# Patient Record
Sex: Female | Born: 1940 | Race: White | Hispanic: No | Marital: Married | Smoking: Never smoker
Health system: Southern US, Community
[De-identification: ages and names within clinical notes are randomized; demographics above are authoritative.]

## PROBLEM LIST (undated history)

## (undated) DIAGNOSIS — R05 Cough: Secondary | ICD-10-CM

## (undated) DIAGNOSIS — Z9889 Other specified postprocedural states: Secondary | ICD-10-CM

## (undated) DIAGNOSIS — K5790 Diverticulosis of intestine, part unspecified, without perforation or abscess without bleeding: Secondary | ICD-10-CM

## (undated) DIAGNOSIS — R011 Cardiac murmur, unspecified: Secondary | ICD-10-CM

## (undated) DIAGNOSIS — K589 Irritable bowel syndrome without diarrhea: Secondary | ICD-10-CM

## (undated) DIAGNOSIS — K219 Gastro-esophageal reflux disease without esophagitis: Secondary | ICD-10-CM

## (undated) DIAGNOSIS — R112 Nausea with vomiting, unspecified: Secondary | ICD-10-CM

## (undated) DIAGNOSIS — R053 Chronic cough: Secondary | ICD-10-CM

## (undated) DIAGNOSIS — M1990 Unspecified osteoarthritis, unspecified site: Secondary | ICD-10-CM

## (undated) DIAGNOSIS — J310 Chronic rhinitis: Secondary | ICD-10-CM

## (undated) DIAGNOSIS — J300 Vasomotor rhinitis: Secondary | ICD-10-CM

## (undated) DIAGNOSIS — N390 Urinary tract infection, site not specified: Secondary | ICD-10-CM

## (undated) DIAGNOSIS — K222 Esophageal obstruction: Secondary | ICD-10-CM

## (undated) DIAGNOSIS — I10 Essential (primary) hypertension: Secondary | ICD-10-CM

## (undated) HISTORY — PX: DILATION AND CURETTAGE OF UTERUS: SHX78

## (undated) HISTORY — DX: Chronic rhinitis: J31.0

## (undated) HISTORY — DX: Esophageal obstruction: K22.2

## (undated) HISTORY — DX: Cough: R05

## (undated) HISTORY — DX: Chronic cough: R05.3

## (undated) HISTORY — DX: Irritable bowel syndrome, unspecified: K58.9

## (undated) HISTORY — DX: Vasomotor rhinitis: J30.0

## (undated) HISTORY — DX: Gastro-esophageal reflux disease without esophagitis: K21.9

## (undated) HISTORY — DX: Diverticulosis of intestine, part unspecified, without perforation or abscess without bleeding: K57.90

## (undated) HISTORY — PX: APPENDECTOMY: SHX54

## (undated) HISTORY — PX: ABDOMINAL HYSTERECTOMY: SHX81

---

## 1998-11-13 ENCOUNTER — Ambulatory Visit (HOSPITAL_COMMUNITY): Admission: RE | Admit: 1998-11-13 | Discharge: 1998-11-13 | Payer: Self-pay | Admitting: Internal Medicine

## 1998-11-13 ENCOUNTER — Encounter: Payer: Self-pay | Admitting: Internal Medicine

## 1999-11-17 ENCOUNTER — Ambulatory Visit (HOSPITAL_COMMUNITY): Admission: RE | Admit: 1999-11-17 | Discharge: 1999-11-17 | Payer: Self-pay | Admitting: Internal Medicine

## 1999-11-17 ENCOUNTER — Encounter: Payer: Self-pay | Admitting: Internal Medicine

## 2000-11-28 ENCOUNTER — Encounter: Payer: Self-pay | Admitting: Internal Medicine

## 2000-11-28 ENCOUNTER — Ambulatory Visit (HOSPITAL_COMMUNITY): Admission: RE | Admit: 2000-11-28 | Discharge: 2000-11-28 | Payer: Self-pay | Admitting: Internal Medicine

## 2001-12-05 ENCOUNTER — Encounter: Payer: Self-pay | Admitting: Internal Medicine

## 2001-12-05 ENCOUNTER — Ambulatory Visit (HOSPITAL_COMMUNITY): Admission: RE | Admit: 2001-12-05 | Discharge: 2001-12-05 | Payer: Self-pay | Admitting: Internal Medicine

## 2002-06-21 ENCOUNTER — Ambulatory Visit (HOSPITAL_COMMUNITY): Admission: RE | Admit: 2002-06-21 | Discharge: 2002-06-21 | Payer: Self-pay | Admitting: Gastroenterology

## 2002-12-18 ENCOUNTER — Ambulatory Visit (HOSPITAL_COMMUNITY): Admission: RE | Admit: 2002-12-18 | Discharge: 2002-12-18 | Payer: Self-pay | Admitting: Internal Medicine

## 2002-12-18 ENCOUNTER — Encounter: Payer: Self-pay | Admitting: Internal Medicine

## 2003-12-25 ENCOUNTER — Ambulatory Visit (HOSPITAL_COMMUNITY): Admission: RE | Admit: 2003-12-25 | Discharge: 2003-12-25 | Payer: Self-pay | Admitting: Internal Medicine

## 2004-12-25 ENCOUNTER — Ambulatory Visit (HOSPITAL_COMMUNITY): Admission: RE | Admit: 2004-12-25 | Discharge: 2004-12-25 | Payer: Self-pay | Admitting: Emergency Medicine

## 2005-12-30 ENCOUNTER — Ambulatory Visit (HOSPITAL_COMMUNITY): Admission: RE | Admit: 2005-12-30 | Discharge: 2005-12-30 | Payer: Self-pay | Admitting: *Deleted

## 2006-01-28 ENCOUNTER — Ambulatory Visit: Payer: Self-pay | Admitting: Critical Care Medicine

## 2006-03-23 ENCOUNTER — Ambulatory Visit: Payer: Self-pay | Admitting: Critical Care Medicine

## 2006-12-11 ENCOUNTER — Emergency Department (HOSPITAL_COMMUNITY): Admission: EM | Admit: 2006-12-11 | Discharge: 2006-12-11 | Payer: Self-pay | Admitting: Emergency Medicine

## 2006-12-12 ENCOUNTER — Encounter: Admission: RE | Admit: 2006-12-12 | Discharge: 2006-12-12 | Payer: Self-pay | Admitting: Orthopedic Surgery

## 2007-01-06 ENCOUNTER — Ambulatory Visit (HOSPITAL_COMMUNITY): Admission: RE | Admit: 2007-01-06 | Discharge: 2007-01-06 | Payer: Self-pay | Admitting: Emergency Medicine

## 2008-01-08 ENCOUNTER — Ambulatory Visit (HOSPITAL_COMMUNITY): Admission: RE | Admit: 2008-01-08 | Discharge: 2008-01-08 | Payer: Self-pay | Admitting: Emergency Medicine

## 2008-12-04 ENCOUNTER — Encounter: Payer: Self-pay | Admitting: Critical Care Medicine

## 2009-01-08 ENCOUNTER — Ambulatory Visit (HOSPITAL_COMMUNITY): Admission: RE | Admit: 2009-01-08 | Discharge: 2009-01-08 | Payer: Self-pay | Admitting: Emergency Medicine

## 2009-06-10 ENCOUNTER — Encounter: Admission: RE | Admit: 2009-06-10 | Discharge: 2009-06-10 | Payer: Self-pay | Admitting: Allergy

## 2010-01-02 ENCOUNTER — Encounter: Admission: RE | Admit: 2010-01-02 | Discharge: 2010-01-02 | Payer: Self-pay | Admitting: Gastroenterology

## 2010-01-09 ENCOUNTER — Ambulatory Visit (HOSPITAL_COMMUNITY): Admission: RE | Admit: 2010-01-09 | Discharge: 2010-01-09 | Payer: Self-pay | Admitting: *Deleted

## 2010-09-11 NOTE — Assessment & Plan Note (Signed)
[facility]                               PULMONARY OFFICE NOTE   [name], [name]                    MRN:          [id number]  DATE:01/28/2006                            DOB:          [birth year]    This is a 70 year old white female who has a chronic cough.  She is referred  for evaluation of same.  The cough occurs in the morning and night.  It is  occasionally productive of thick white mucus, worse with exercise.  She has  had this for 10 years.  She lived in [place] for 5 years and when in occluded  environment, developed this cough.  She has been back in the [place]  and moved here.  Ever since, the cough has persisted.  Mostly mucus is  clear, but mostly also dry.  There is no wheezing.  She coughs worse if she  exercises.  She has no shortness of breath.  Mucinex DM seems to help.  Advair and Singulair did not help.  She denies any heartburn, but feels like  there is pressure in the chest.  She has some chest discomfort.  No real  overt heartburn.  No indigestion or loss of appetite.  She has some  difficulty swallowing.  Denies any sore throat.  Does have some nasal  congestion.  Denies any sneezing, itching, earache, anxiety, depression,  hand or foot swelling.  She denies any joint stiffness or pain, and her  symptoms have persisted.  She is a life-long never smoker.  Works as a  housewife.   PAST MEDICAL HISTORY:  No history of hypertension or heart disease.  Does  have a heart murmur and hypercholesterolemia.  Otherwise, no positive  medical history.  She had a hysterectomy in 1990, tubal ligation 32 years  ago, appendectomy 1990.   MEDICATION ALLERGIES:  None.   CURRENT MEDICATIONS:  1. Premarin 0.625 mg daily.  2. Multivitamin daily.  3. Calcium daily.  4. Green tea daily.  5. Fish oil daily.  6. Mucinex daily.   SOCIAL HISTORY:  Life-long never smoker but drinks 3 glasses of wine daily.  Is married.   FAMILY  HISTORY:  Noncontributory.   REVIEW OF SYSTEMS:  Otherwise noncontributory.   PHYSICAL EXAM:  This is a well-developed, well-nourished middle-aged female  in no distress.  Temperature 98, blood pressure 110/70, pulse 60, saturation 98% on room air.  CHEST:  Clear without evidence of any wheeze, rales, or rhonchi.  There were  no adventitious breath sounds noted.  CARDIAC:  Regular rate and rhythm without S3.  Normal S1, S2.  ABDOMEN:  Soft and nontender.  EXTREMITIES:  No edema, clubbing, or venous disease.  SKIN:  Intact and clear.  HEENT:  No jugular venous distension.  No lymphadenopathy.  Nares were clear  except for mild nares inflammation.   LABORATORY DATA:  Spirometry shows normal spirometry.  Chest x-ray showed no  active disease process.   IMPRESSION:  Cyclical cough with associated reflux disease.  No true asthma.   RECOMMENDATIONS:  Begin BroveX 5 mL b.i.d.  Begin  Zegerid 40 mg daily.  No  systemic steroids or inhaled medicines are needed.  She will discontinue the  fish oil.  She was given a reflux diet and will be prescribed BroveX 5 mL  b.i.d.  Also given the cyclical cough protocol with Tussionex and Tessalon  Perles.  Will see the patient back in return followup in 1 month.       Charlcie Cradle Delford Field, MD, [facility]      PEW/MedQ  DD:  01/28/2006  DT:  01/31/2006  Job #:  [id number]   cc:   Oley Balm. Georgina Pillion, M.D.

## 2010-09-11 NOTE — Assessment & Plan Note (Signed)
[facility]                             PULMONARY OFFICE NOTE   CLOTEAL, ISAACSON                    MRN:          [id number]  DATE:03/23/2006                            DOB:          1940-10-06    Ms. [name] is a 70 year old white female, history of cyclic cough,  reflux disease, but no true asthma.  Her cough is still persisting but  slightly better.  She is on the Brovex nightly, Zegerid daily.  She is  off the fish oil.  She uses [employer] only p.r.n.   PHYSICAL EXAMINATION:  Temp 97, blood pressure 120/78, pulse 63,  saturation 97% on room air.  CHEST:  Showed to be clear without evidence of wheeze or rhonchi.  CARDIAC:  Exam showed a regular rate and rhythm without S3, normal S1,  S2.  ABDOMEN:  Soft, nontender.  EXTREMITIES:  Showed no edema or clubbing.  SKIN:  Clear.   IMPRESSION:  1. Cyclical cough.  2. Mild reflux disease.   PLAN:  1. Finish course of Zegerid.  2. Use Ultram 50 mg q. 6 on schedule for 10 days then stop.  3. Use Brovex nightly.  4. Use Tessalon Perles 1 to 2 every eight hours on schedule.  5. We will see the patient back in return followup in 6 weeks.     Charlcie Cradle Delford Field, MD, [facility]  Electronically Signed    PEW/MedQ  DD: 03/23/2006  DT: 03/23/2006  Job #: [id number]

## 2010-12-01 ENCOUNTER — Other Ambulatory Visit (HOSPITAL_COMMUNITY): Payer: Self-pay | Admitting: Family Medicine

## 2010-12-01 DIAGNOSIS — Z1231 Encounter for screening mammogram for malignant neoplasm of breast: Secondary | ICD-10-CM

## 2010-12-29 ENCOUNTER — Telehealth: Payer: Self-pay | Admitting: Gastroenterology

## 2010-12-29 NOTE — Telephone Encounter (Signed)
Forwarded to Dr. Arlyce Dice for review.

## 2011-01-11 ENCOUNTER — Ambulatory Visit (HOSPITAL_COMMUNITY)
Admission: RE | Admit: 2011-01-11 | Discharge: 2011-01-11 | Disposition: A | Discharge status: Home / Self Care | Payer: Medicare Other | Source: Ambulatory Visit | Attending: Family Medicine | Admitting: Family Medicine

## 2011-01-11 DIAGNOSIS — Z1231 Encounter for screening mammogram for malignant neoplasm of breast: Secondary | ICD-10-CM | POA: Insufficient documentation

## 2011-01-21 ENCOUNTER — Encounter: Payer: Self-pay | Admitting: Gastroenterology

## 2011-02-18 ENCOUNTER — Ambulatory Visit (INDEPENDENT_AMBULATORY_CARE_PROVIDER_SITE_OTHER): Payer: Medicare Other | Admitting: Gastroenterology

## 2011-02-18 ENCOUNTER — Encounter: Payer: Self-pay | Admitting: Gastroenterology

## 2011-02-18 DIAGNOSIS — R1310 Dysphagia, unspecified: Secondary | ICD-10-CM | POA: Insufficient documentation

## 2011-02-18 DIAGNOSIS — K219 Gastro-esophageal reflux disease without esophagitis: Secondary | ICD-10-CM | POA: Insufficient documentation

## 2011-02-18 NOTE — Assessment & Plan Note (Addendum)
She continues to be quite symptomatic despite medical therapy.  Recommendations #1 upper GI series to determine her anatomy. #2 trial of Exelon to 60 mg before breakfast and dinner #3 to consider gastric M.D. scan pending results of above and her response to Dexon #4 if the patient remains symptomatic despite medical therapy then I would consider a fundoplication

## 2011-02-18 NOTE — Progress Notes (Signed)
[name] is a pleasant 70 year old white female referred at the request of Dr.Thacker for evaluation of cough. For years she's been suffering from a chronic cough that was recently diagnosed as asthma. Despite taking PPIs cough has continued. She denies sore throat or hoarseness or pyrosis. She sleeps sitting up. Symptoms are worsened postprandially. She has a history of a esophageal stricture which was dilated in 2009. She is complaining of recurrent dysphagia to solids. She does drink wine and drinks coffee. She denies abdominal pain.      Past Medical History  Diagnosis Date  . Chronic cough   . Rhinitis   . Vasomotor rhinitis   . GERD (gastroesophageal reflux disease)   . Diverticulosis     left sided  2004  . Esophageal stricture   . IBS (irritable bowel syndrome)    Past Surgical History  Procedure Date  . Appendectomy   . Abdominal hysterectomy     reports that she has never smoked. She has never used smokeless tobacco. She reports that she drinks alcohol. She reports that she does not use illicit drugs. family history includes Heart disease in an unspecified family member.  Current medications and social history were reviewed in [employer] electronic medical record  Review of Systems: She complains of soreness in her right arm which each abuse to an old fracture Pertinent positive and negative review of systems were noted in the above HPI section. All other review of systems were otherwise negative.  Vital signs were reviewed in today's medical record Physical Exam: General: Well developed , well nourished, no acute distress Head: Normocephalic and atraumatic Eyes:  sclerae anicteric, EOMI Ears: Normal auditory acuity Mouth: No deformity or lesions Neck: Supple, no masses or thyromegaly Lungs: Clear throughout to auscultation Heart: Regular rate and rhythm; no murmurs, rubs or bruits Abdomen: Soft, non tender and non distended. No masses, hepatosplenomegaly or  hernias noted. Normal Bowel sounds Rectal:deferred Musculoskeletal: Symmetrical with no gross deformities  Skin: No lesions on visible extremities Pulses:  Normal pulses noted Extremities: No clubbing, cyanosis, edema or deformities noted Neurological: Alert oriented x 4, grossly nonfocal Cervical Nodes:  No significant cervical adenopathy Inguinal Nodes: No significant inguinal adenopathy Psychological:  Alert and cooperative. Normal mood and affect

## 2011-02-18 NOTE — Assessment & Plan Note (Signed)
This is likely due to a recurrent esophageal stricture.  Recommendations #1 barium swallow

## 2011-02-18 NOTE — Patient Instructions (Addendum)
We are giving you Dexilant samples today You are scheduled for a Upper GI Series at [facility] on 02/23/2011 at 10:45am If you need to reschedule this appointment please contact radiology at [phone]

## 2011-02-23 ENCOUNTER — Other Ambulatory Visit (HOSPITAL_COMMUNITY): Payer: Medicare Other

## 2011-02-23 ENCOUNTER — Ambulatory Visit (HOSPITAL_COMMUNITY)
Admission: RE | Admit: 2011-02-23 | Discharge: 2011-02-23 | Disposition: A | Discharge status: Home / Self Care | Payer: Medicare Other | Source: Ambulatory Visit | Attending: Gastroenterology | Admitting: Gastroenterology

## 2011-02-23 ENCOUNTER — Telehealth: Payer: Self-pay

## 2011-02-23 DIAGNOSIS — R059 Cough, unspecified: Secondary | ICD-10-CM | POA: Insufficient documentation

## 2011-02-23 DIAGNOSIS — R05 Cough: Secondary | ICD-10-CM | POA: Insufficient documentation

## 2011-02-23 DIAGNOSIS — K219 Gastro-esophageal reflux disease without esophagitis: Secondary | ICD-10-CM | POA: Insufficient documentation

## 2011-02-23 DIAGNOSIS — R1310 Dysphagia, unspecified: Secondary | ICD-10-CM | POA: Insufficient documentation

## 2011-02-23 NOTE — Telephone Encounter (Signed)
Pt states that Dr. Arlyce Dice placed her on Dexilant 60mg  in the am and pm. Pt has 10 pills left and wants to know if she should continue taking it twice a day. Dr. Arlyce Dice please advise.

## 2011-02-23 NOTE — Telephone Encounter (Signed)
Message copied by Michele Mcalpine on Tue Feb 23, 2011  4:36 PM ------      Message from: Melvia Heaps D      Created: Tue Feb 23, 2011  2:19 PM       Please schedule EGD with [facility] pH study ; she should continue taking her PPI

## 2011-02-24 NOTE — Telephone Encounter (Signed)
yes

## 2011-02-25 ENCOUNTER — Telehealth: Payer: Self-pay

## 2011-02-25 MED ORDER — DEXLANSOPRAZOLE 60 MG PO CPDR: 60.0000 mg | DELAYED_RELEASE_CAPSULE | Freq: Two times a day (BID) | ORAL | Status: DC

## 2011-02-25 NOTE — Telephone Encounter (Signed)
Booking number for EGD with [facility] #[phone].

## 2011-02-25 NOTE — Telephone Encounter (Signed)
Left message for pt to call back  °

## 2011-02-25 NOTE — Telephone Encounter (Signed)
Pt scheduled for EGD with [facility] @[facility]  03/22/11 arrival time 11:30am for a 12:30pm appt. Pt aware of appt date and time. Rx for dexilant sent to pharmacy.

## 2011-02-26 ENCOUNTER — Encounter: Payer: Medicare Other | Admitting: Gastroenterology

## 2011-03-01 ENCOUNTER — Telehealth: Payer: Self-pay | Admitting: Gastroenterology

## 2011-03-01 NOTE — Telephone Encounter (Signed)
Returned pts call, Informed her that an authorization was completed and submitted on Friday to her insurance company, Have not heard back from her [employer] yet on whether its been approved or not

## 2011-03-01 NOTE — Telephone Encounter (Signed)
Dottie spoke with pt, will provide samples for pt till her office appointment. Pharmacy wants 90 day supply sent but pt needs to be seen in the office first,

## 2011-03-02 ENCOUNTER — Telehealth: Payer: Self-pay | Admitting: *Deleted

## 2011-03-02 NOTE — Telephone Encounter (Signed)
Patient aware Dexilant approved till 2014

## 2011-03-22 ENCOUNTER — Encounter (HOSPITAL_COMMUNITY): Payer: Self-pay | Admitting: Gastroenterology

## 2011-03-22 ENCOUNTER — Encounter (HOSPITAL_COMMUNITY)
Admission: RE | Disposition: A | Discharge status: Home / Self Care | Payer: Self-pay | Source: Ambulatory Visit | Attending: Gastroenterology

## 2011-03-22 ENCOUNTER — Ambulatory Visit (HOSPITAL_COMMUNITY)
Admission: RE | Admit: 2011-03-22 | Discharge: 2011-03-22 | Disposition: A | Discharge status: Home / Self Care | Payer: Medicare Other | Source: Ambulatory Visit | Attending: Gastroenterology | Admitting: Gastroenterology

## 2011-03-22 DIAGNOSIS — R05 Cough: Secondary | ICD-10-CM | POA: Insufficient documentation

## 2011-03-22 DIAGNOSIS — K219 Gastro-esophageal reflux disease without esophagitis: Secondary | ICD-10-CM

## 2011-03-22 DIAGNOSIS — R059 Cough, unspecified: Secondary | ICD-10-CM | POA: Insufficient documentation

## 2011-03-22 HISTORY — DX: Cardiac murmur, unspecified: R01.1

## 2011-03-22 HISTORY — PX: BRAVO PH STUDY: SHX5421

## 2011-03-22 HISTORY — PX: ESOPHAGOGASTRODUODENOSCOPY: SHX5428

## 2011-03-22 HISTORY — DX: Unspecified osteoarthritis, unspecified site: M19.90

## 2011-03-22 SURGERY — EGD (ESOPHAGOGASTRODUODENOSCOPY)
Anesthesia: Moderate Sedation

## 2011-03-22 MED ORDER — DEXLANSOPRAZOLE 60 MG PO CPDR: 60.0000 mg | DELAYED_RELEASE_CAPSULE | Freq: Two times a day (BID) | ORAL | Status: DC

## 2011-03-22 MED ORDER — FENTANYL CITRATE 0.05 MG/ML IJ SOLN
INTRAMUSCULAR | Status: AC
  Filled 2011-03-22: qty 2

## 2011-03-22 MED ORDER — MIDAZOLAM HCL 10 MG/2ML IJ SOLN
INTRAMUSCULAR | Status: AC
  Filled 2011-03-22: qty 2

## 2011-03-22 MED ORDER — GLYCOPYRROLATE 0.2 MG/ML IJ SOLN
INTRAMUSCULAR | Status: DC | PRN
  Administered 2011-03-22: 0.2 mg via INTRAVENOUS

## 2011-03-22 MED ORDER — GLYCOPYRROLATE 0.2 MG/ML IJ SOLN
INTRAMUSCULAR | Status: AC
  Filled 2011-03-22: qty 1

## 2011-03-22 MED ORDER — FENTANYL NICU IV SYRINGE 50 MCG/ML
INJECTION | INTRAMUSCULAR | Status: DC | PRN
  Administered 2011-03-22 (×3): 25 ug via INTRAVENOUS

## 2011-03-22 MED ORDER — BUTAMBEN-TETRACAINE-BENZOCAINE 2-2-14 % EX AERO
INHALATION_SPRAY | CUTANEOUS | Status: DC | PRN
  Administered 2011-03-22: 1 via TOPICAL

## 2011-03-22 MED ORDER — MIDAZOLAM HCL 5 MG/5ML IJ SOLN
INTRAMUSCULAR | Status: DC | PRN
  Administered 2011-03-22: 2 mg via INTRAVENOUS
  Administered 2011-03-22: 1 mg via INTRAVENOUS
  Administered 2011-03-22: 2 mg via INTRAVENOUS

## 2011-03-22 MED ORDER — SODIUM CHLORIDE 0.9 % IV SOLN
INTRAVENOUS | Status: DC
  Administered 2011-03-22: 20 mL via INTRAVENOUS

## 2011-03-22 NOTE — H&P (Signed)
Dysphagia, unspecified     787.20      Reason for Visit     Gastrophageal Reflux    Dysphagia       Reason For Visit History Recorded        Vitals - Last Recorded       BP Pulse Ht Wt BMI    156/70  64  5\' 2"  (1.575 m)  52.617 kg (116 lb)  21.22 kg/m2       Progress Notes     Linda Heaps, MD  02/19/2011  9:20 AM  Signed [name] is a pleasant 70 year old white female referred at the request of Dr.Thacker for evaluation of cough. For years she's been suffering from a chronic cough that was recently diagnosed as asthma. Despite taking PPIs cough has continued. She denies sore throat or hoarseness or pyrosis. She sleeps sitting up. Symptoms are worsened postprandially. She has a history of a esophageal stricture which was dilated in 2009. She is complaining of recurrent dysphagia to solids. She does drink wine and drinks coffee. She denies abdominal pain.            Past Medical History   Diagnosis  Date   .  Chronic cough     .  Rhinitis     .  Vasomotor rhinitis     .  GERD (gastroesophageal reflux disease)     .  Diverticulosis         left sided  2004   .  Esophageal stricture     .  IBS (irritable bowel syndrome)      Past Surgical History   Procedure  Date   .  Appendectomy     .  Abdominal hysterectomy      reports that she has never smoked. She has never used smokeless tobacco. She reports that she drinks alcohol. She reports that she does not use illicit drugs. family history includes Heart disease in an unspecified family member.   Current medications and social history were reviewed in [employer] electronic medical record   Review of Systems: She complains of soreness in her right arm which each abuse to an old fracture Pertinent positive and negative review of systems were noted in the above HPI section. All other review of systems were otherwise negative.   Vital signs were reviewed in today's medical record Physical Exam: General:  Well developed , well nourished, no acute distress Head: Normocephalic and atraumatic Eyes:  sclerae anicteric, EOMI Ears: Normal auditory acuity Mouth: No deformity or lesions Neck: Supple, no masses or thyromegaly Lungs: Clear throughout to auscultation Heart: Regular rate and rhythm; no murmurs, rubs or bruits Abdomen: Soft, non tender and non distended. No masses, hepatosplenomegaly or hernias noted. Normal Bowel sounds Rectal:deferred Musculoskeletal: Symmetrical with no gross deformities   Skin: No lesions on visible extremities Pulses:  Normal pulses noted Extremities: No clubbing, cyanosis, edema or deformities noted Neurological: Alert oriented x 4, grossly nonfocal Cervical Nodes:  No significant cervical adenopathy Inguinal Nodes: No significant inguinal adenopathy Psychological:  Alert and cooperative. Normal mood and affect                  Dysphagia, unspecified - Linda Heaps, MD  02/18/2011 11:45 AM  Signed This is likely due to a recurrent esophageal stricture.   Recommendations #1 barium swallow  Esophageal reflux - Linda Heaps, MD  02/19/2011  9:20 AM  Addendum She continues to be quite symptomatic despite medical therapy.  Recommendations #1 upper GI series to determine her anatomy. #2 trial of Exelon to 60 mg before breakfast and dinner #3 to consider gastric M.D. scan pending results of above and her response to Dexon #4 if the patient remains symptomatic despite medical therapy then I would consider a fundoplication   The recent H&P (dated *02/18/11**) was reviewed, the patient was examined and there is no change in the patients condition since that H&P was completed.   [name]  03/22/2011, 12:48 PM

## 2011-03-22 NOTE — Brief Op Note (Signed)
03/22/2011  1:18 PM  PATIENT:  [name]  70 y.o. female  Endoscopy report is contained in the [facility] report.

## 2011-03-23 ENCOUNTER — Encounter (HOSPITAL_COMMUNITY): Payer: Self-pay | Admitting: Gastroenterology

## 2011-03-25 ENCOUNTER — Encounter (HOSPITAL_COMMUNITY): Payer: Self-pay

## 2011-04-01 ENCOUNTER — Ambulatory Visit (INDEPENDENT_AMBULATORY_CARE_PROVIDER_SITE_OTHER): Payer: Medicare Other | Admitting: Gastroenterology

## 2011-04-01 ENCOUNTER — Encounter: Payer: Self-pay | Admitting: Gastroenterology

## 2011-04-01 VITALS — BP 120/64 | HR 72 | Ht 62.0 in | Wt 115.0 lb

## 2011-04-01 DIAGNOSIS — K219 Gastro-esophageal reflux disease without esophagitis: Secondary | ICD-10-CM

## 2011-04-01 DIAGNOSIS — R059 Cough, unspecified: Secondary | ICD-10-CM

## 2011-04-01 DIAGNOSIS — R05 Cough: Secondary | ICD-10-CM

## 2011-04-01 MED ORDER — ALPRAZOLAM 1 MG PO TABS: ORAL_TABLET | ORAL | Status: DC

## 2011-04-01 NOTE — Progress Notes (Signed)
Patient ID: [name], female   DOB: July 07, 1940, 70 y.o.   MRN: [id number] 48 hour pH probe was placed on March 22, 2011. Patient continued her PPI medicine.  On day one there were 63 episodes of reflux. Total time pH less than 4 was 27 minutes the fraction of time pH less than 4 was 2.2. Total DeMeester score is 12.2. On day 2 was only one episode of reflux. Total DeMeester score was 0.5. The patient had multiple episodes of coughing in the absence of documented acid reflux.  Impression No evidence of significant acid reflux during the 48 hours of observation.

## 2011-04-01 NOTE — Patient Instructions (Signed)
We are printing you a prescription today You will need to call Bonita Quin back next week and let her know how your doing for Dr Arlyce Dice Follow up in February

## 2011-04-01 NOTE — Progress Notes (Signed)
History of Present Illness:  [name] has returned for followup of her cough. 48 hour [facility] pH study did not demonstrate significant acid reflux.  No strictures were seen. During this time she had frequent coughing spells. She describes the need to cough to clear her throat. She admits to being a very tense, high wired person.      Review of Systems: Pertinent positive and negative review of systems were noted in the above HPI section. All other review of systems were otherwise negative.    Current Medications, Allergies, Past Medical History, Past Surgical History, Family History and Social History were reviewed in [employer] electronic medical record  Vital signs were reviewed in today's medical record. Physical Exam: General: Well developed , well nourished, no acute distress

## 2011-04-01 NOTE — Assessment & Plan Note (Addendum)
The patient has a sensation of globus and may be coughing on account of this. This may be due to anxiety.  Alternatively she may have postnasal drip causing coughing.  Non acid reflux is also a consideration.  Recommendations #1 trial of Xanax 1 mg every 8-12 hours #2 if he's not improved with Xanax alone then I will obtain a gastric empty scan and start her on Zyrtec and Mucinex

## 2011-12-09 ENCOUNTER — Other Ambulatory Visit (HOSPITAL_COMMUNITY): Payer: Self-pay | Admitting: Family Medicine

## 2011-12-09 DIAGNOSIS — Z1231 Encounter for screening mammogram for malignant neoplasm of breast: Secondary | ICD-10-CM

## 2012-01-14 ENCOUNTER — Ambulatory Visit (HOSPITAL_COMMUNITY)
Admission: RE | Admit: 2012-01-14 | Discharge: 2012-01-14 | Disposition: A | Discharge status: Home / Self Care | Payer: Medicare Other | Source: Ambulatory Visit | Attending: Family Medicine | Admitting: Family Medicine

## 2012-01-14 DIAGNOSIS — Z1231 Encounter for screening mammogram for malignant neoplasm of breast: Secondary | ICD-10-CM | POA: Insufficient documentation

## 2012-12-14 ENCOUNTER — Other Ambulatory Visit (HOSPITAL_COMMUNITY): Payer: Self-pay | Admitting: Family Medicine

## 2012-12-14 DIAGNOSIS — Z1231 Encounter for screening mammogram for malignant neoplasm of breast: Secondary | ICD-10-CM

## 2013-01-31 ENCOUNTER — Ambulatory Visit (HOSPITAL_COMMUNITY)
Admission: RE | Admit: 2013-01-31 | Discharge: 2013-01-31 | Disposition: A | Discharge status: Home / Self Care | Payer: Medicare Other | Source: Ambulatory Visit | Attending: Family Medicine | Admitting: Family Medicine

## 2013-01-31 DIAGNOSIS — Z1231 Encounter for screening mammogram for malignant neoplasm of breast: Secondary | ICD-10-CM | POA: Insufficient documentation

## 2014-01-01 ENCOUNTER — Other Ambulatory Visit (HOSPITAL_COMMUNITY): Payer: Self-pay | Admitting: Family Medicine

## 2014-01-01 DIAGNOSIS — Z1231 Encounter for screening mammogram for malignant neoplasm of breast: Secondary | ICD-10-CM

## 2014-02-05 ENCOUNTER — Other Ambulatory Visit (HOSPITAL_COMMUNITY): Payer: Self-pay | Admitting: Family Medicine

## 2014-02-05 ENCOUNTER — Ambulatory Visit (HOSPITAL_COMMUNITY)
Admission: RE | Admit: 2014-02-05 | Discharge: 2014-02-05 | Disposition: A | Discharge status: Home / Self Care | Payer: Medicare PPO | Source: Ambulatory Visit | Attending: Family Medicine | Admitting: Family Medicine

## 2014-02-05 DIAGNOSIS — Z1231 Encounter for screening mammogram for malignant neoplasm of breast: Secondary | ICD-10-CM

## 2015-01-08 ENCOUNTER — Other Ambulatory Visit: Payer: Self-pay

## 2015-01-08 DIAGNOSIS — Z1231 Encounter for screening mammogram for malignant neoplasm of breast: Secondary | ICD-10-CM

## 2015-02-12 ENCOUNTER — Ambulatory Visit
Admission: RE | Admit: 2015-02-12 | Discharge: 2015-02-12 | Disposition: A | Discharge status: Home / Self Care | Payer: Medicare PPO | Source: Ambulatory Visit

## 2015-02-12 DIAGNOSIS — Z1231 Encounter for screening mammogram for malignant neoplasm of breast: Secondary | ICD-10-CM

## 2016-01-19 ENCOUNTER — Other Ambulatory Visit: Payer: Self-pay | Admitting: Family Medicine

## 2016-01-19 DIAGNOSIS — Z1231 Encounter for screening mammogram for malignant neoplasm of breast: Secondary | ICD-10-CM

## 2016-02-16 ENCOUNTER — Ambulatory Visit
Admission: RE | Admit: 2016-02-16 | Discharge: 2016-02-16 | Disposition: A | Discharge status: Home / Self Care | Payer: Medicare PPO | Source: Ambulatory Visit | Attending: Family Medicine | Admitting: Family Medicine

## 2016-02-16 DIAGNOSIS — Z1231 Encounter for screening mammogram for malignant neoplasm of breast: Secondary | ICD-10-CM

## 2017-01-24 ENCOUNTER — Other Ambulatory Visit: Payer: Self-pay | Admitting: Family Medicine

## 2017-01-24 DIAGNOSIS — Z1231 Encounter for screening mammogram for malignant neoplasm of breast: Secondary | ICD-10-CM

## 2017-02-15 ENCOUNTER — Other Ambulatory Visit: Payer: Self-pay | Admitting: Family Medicine

## 2017-02-15 DIAGNOSIS — R739 Hyperglycemia, unspecified: Secondary | ICD-10-CM | POA: Diagnosis not present

## 2017-02-15 DIAGNOSIS — E785 Hyperlipidemia, unspecified: Secondary | ICD-10-CM | POA: Diagnosis not present

## 2017-02-15 DIAGNOSIS — Z23 Encounter for immunization: Secondary | ICD-10-CM | POA: Diagnosis not present

## 2017-02-15 DIAGNOSIS — R05 Cough: Secondary | ICD-10-CM | POA: Diagnosis not present

## 2017-02-15 DIAGNOSIS — R059 Cough, unspecified: Secondary | ICD-10-CM

## 2017-02-15 DIAGNOSIS — I10 Essential (primary) hypertension: Secondary | ICD-10-CM | POA: Diagnosis not present

## 2017-02-16 ENCOUNTER — Ambulatory Visit
Admission: RE | Admit: 2017-02-16 | Discharge: 2017-02-16 | Disposition: A | Discharge status: Home / Self Care | Payer: Medicare PPO | Source: Ambulatory Visit | Attending: Family Medicine | Admitting: Family Medicine

## 2017-02-16 DIAGNOSIS — R05 Cough: Secondary | ICD-10-CM | POA: Diagnosis not present

## 2017-02-16 DIAGNOSIS — R059 Cough, unspecified: Secondary | ICD-10-CM

## 2017-02-21 ENCOUNTER — Ambulatory Visit
Admission: RE | Admit: 2017-02-21 | Discharge: 2017-02-21 | Disposition: A | Discharge status: Home / Self Care | Payer: Medicare PPO | Source: Ambulatory Visit | Attending: Family Medicine | Admitting: Family Medicine

## 2017-02-21 DIAGNOSIS — Z1231 Encounter for screening mammogram for malignant neoplasm of breast: Secondary | ICD-10-CM

## 2017-03-03 ENCOUNTER — Encounter: Payer: Self-pay | Admitting: Internal Medicine

## 2017-03-03 ENCOUNTER — Ambulatory Visit: Payer: Medicare PPO | Admitting: Internal Medicine

## 2017-03-03 VITALS — BP 122/66 | HR 60 | Ht 62.75 in | Wt 122.0 lb

## 2017-03-03 DIAGNOSIS — R058 Other specified cough: Secondary | ICD-10-CM

## 2017-03-03 DIAGNOSIS — R05 Cough: Secondary | ICD-10-CM

## 2017-03-03 DIAGNOSIS — I10 Essential (primary) hypertension: Secondary | ICD-10-CM | POA: Diagnosis not present

## 2017-03-03 MED ORDER — LOSARTAN POTASSIUM 50 MG PO TABS: 50.0000 mg | ORAL_TABLET | Freq: Every day | ORAL | 11 refills | Status: AC

## 2017-03-03 NOTE — Progress Notes (Signed)
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 Pulmonary Consult    Referred by Dr. Orland Mustard. Pt c/o cough for 15-20 yrs. Cough is prod with white "foam"- bothers her most first thing in the am.  She started on ACE approx 3 months ago and her cough has not been worse.    i20 yeaer daily incessant cough pattern; coughed mostly  daytime w/in 30 min of stirring  but since acei also waking some at night as well but minimal white mucus production assoc with this  - "the only thing that ever helped my allergies really is drixoral" but not she took the drixoral for sneezing/not coughing / on biphosphonates just for the last 3 years or so and only on boniva monthly  Not limited by breathing from desired activities but ex tends to make the cough worse.    No obvious day to day or daytime variability or assoc excess/ purulent sputum or mucus plugs or hemoptysis or cp or chest tightness, subjective wheeze or overt sinus or hb symptoms. No unusual exp hx or h/o childhood pna/ asthma or knowledge of premature birth.  Sleeping ok flat without nocturnal  or early am exacerbation  of respiratory  c/o's or need for noct saba. Also denies any obvious fluctuation of symptoms with weather or environmental changes or other aggravating or alleviating factors except as  outlined above   Current Allergies, Complete Past Medical History, Past Surgical History, Family History, and Social History were reviewed in [employer] record.  ROS  The following are not active complaints unless bolded Hoarseness, sore throat, dysphagia, dental problems, itching, sneezing,  nasal congestion or discharge of excess mucus or purulent secretions, ear ache,   fever, chills, sweats, unintended wt loss or wt gain, classically pleuritic or exertional cp,  orthopnea pnd or leg swelling, presyncope, palpitations, abdominal pain, anorexia, nausea, vomiting, diarrhea  or change in bowel habits or change in bladder habits, change in stools or change in urine, dysuria, hematuria,  rash, arthralgias, visual complaints, headache, numbness, weakness or ataxia or problems with walking or coordination,  change in mood/affect or memory.        Current Meds  Medication Sig  . boniva  q month.  Marland Kitchen atorvastatin (LIPITOR) 10 MG tablet Take 10 mg daily by mouth.  . Calcium Carbonate-Vitamin D (CALCIUM 600/VITAMIN D) 600-400 MG-UNIT per chew tablet Chew 1 tablet by mouth daily.    . Cholecalciferol (D3-50 PO) Take 1 capsule daily by mouth.  . Coenzyme Q10 (CO Q 10) 100 MG CAPS Take 1 capsule daily by mouth.  . hydrochlorothiazide (HYDRODIURIL) 25 MG tablet Take 25 mg daily by mouth.  . vitamin B-12 (CYANOCOBALAMIN) 500 MCG tablet Take  500 mcg daily by mouth.  . [  lisinopril (PRINIVIL,ZESTRIL) 2.5 MG tablet Take 2.5 mg daily by mouth.                Review of Systems     Objective:   Physical Exam     amb wf nad very hard to get her to talk about specific symptoms, prefers medical diagnoses    Wt Readings from Last 3 Encounters:  03/03/17 122 lb (55.3 kg)  04/01/11 115 lb (52.2 kg)  03/22/11 115 lb (52.2 kg)    Vital signs reviewed - Note on arrival 02 sats  98% on RA     HEENT: nl dentition, turbinates bilaterally, and oropharynx. Nl external ear canals  without cough reflex   NECK :  without JVD/Nodes/TM/ nl carotid upstrokes bilaterally   LUNGS: no acc muscle use,  Nl contour chest which is clear to A and P bilaterally without cough on insp or exp maneuvers   CV:  RRR  no s3 or murmur or increase in P2, and no edema   ABD:  soft and nontender with nl inspiratory excursion in the supine position. No bruits or organomegaly appreciated, bowel sounds nl  MS:  Nl gait/ ext warm without deformities, calf tenderness, cyanosis or clubbing No obvious joint restrictions   SKIN: warm and dry without lesions    NEURO:  alert, approp, nl sensorium with  no motor or cerebellar deficits apparent.      CXR PA and Lateral:   03/03/2017 :    I personally reviewed images and agree with radiology impression as follows:    Negative two view chest x-ray      Assessment:

## 2017-03-03 NOTE — Patient Instructions (Addendum)
For drainage / throat tickle try take CHLORPHENIRAMINE  4 mg - take one every 4 hours as needed - available over the counter- may cause drowsiness so start with just a bedtime dose or two and see how you tolerate it before trying in daytime     Also take Pepcid 20 mg one at bedtime automatically at bedtime   GERD (REFLUX)  is an extremely common cause of respiratory symptoms just like yours , many times with no obvious heartburn at all.    It can be treated with medication, but also with lifestyle changes including elevation of the head of your bed (ideally with 6 inch  bed blocks),  Smoking cessation, avoidance of late meals, excessive alcohol, and avoid fatty foods, chocolate, peppermint, colas, red wine, and acidic juices such as orange juice.  NO MINT OR MENTHOL PRODUCTS SO NO COUGH DROPS  USE SUGARLESS CANDY INSTEAD (Jolley ranchers or Stover's or Life Savers) or even ice chips will also do - the key is to swallow to prevent all throat clearing. NO OIL BASED VITAMINS - use powdered substitutes.   Stop lisinopril and take losartan 50 mg one daily instead    If not all better in 6 weeks please return with all active medications in hand

## 2017-03-04 ENCOUNTER — Encounter: Payer: Self-pay | Admitting: Internal Medicine

## 2017-03-04 ENCOUNTER — Institutional Professional Consult (permissible substitution): Payer: Medicare PPO | Admitting: Pulmonary Disease

## 2017-03-04 DIAGNOSIS — R058 Other specified cough: Secondary | ICD-10-CM | POA: Insufficient documentation

## 2017-03-04 DIAGNOSIS — R05 Cough: Secondary | ICD-10-CM | POA: Insufficient documentation

## 2017-03-04 NOTE — Assessment & Plan Note (Signed)
ACE inhibitors are problematic in  pts with airway complaints because  even experienced pulmonologists can't always distinguish ace effects from copd/asthma.  By themselves they don't actually cause a problem, much like oxygen can't by itself start a fire, but they certainly serve as a powerful catalyst or enhancer for any "fire"  or inflammatory process in the upper airway, be it caused by an ET  tube or more commonly reflux (especially in the obese or pts with known GERD or who are on biphoshonates).    In the era of ARB near equivalency  it just makes sense to avoid ACEI  entirely in the short run and then decide later, having established a level of airway control using a reasonable limited regimen, whether to add back ace but even then being very careful to observe the pt for worsening airway control and number of meds used/ needed to control symptoms.    Try losartan 50 mg daily for now

## 2017-03-04 NOTE — Assessment & Plan Note (Signed)
The most common causes of chronic cough in immunocompetent adults include the following: upper airway cough syndrome (UACS), previously referred to as postnasal drip syndrome (PNDS), which is caused by variety of rhinosinus conditions; (2) asthma; (3) GERD; (4) chronic bronchitis from cigarette smoking or other inhaled environmental irritants; (5) nonasthmatic eosinophilic bronchitis; and (6) bronchiectasis.   These conditions, singly or in combination, have accounted for up to 94% of the causes of chronic cough in prospective studies.   Other conditions have constituted no >6% of the causes in prospective studies These have included bronchogenic carcinoma, chronic interstitial pneumonia, sarcoidosis, left ventricular failure, ACEI-induced cough, and aspiration from a condition associated with pharyngeal dysfunction.    Chronic cough is often simultaneously caused by more than one condition. A single cause has been found from 38 to 82% of the time, multiple causes from 18 to 62%. Multiply caused cough has been the result of three diseases up to 42% of the time.       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   These are the same pts (now being labeled as having "irritable larynx syndrome" by some cough centers) who not infrequently have a history of having failed to tolerate ace inhibitors,  dry powder inhalers or biphosphonates or report having atypical/extraesophageal reflux symptoms that don't respond to standard doses of PPI  and are easily  confused as having aecopd or asthma flares by even experienced allergists/ pulmonologists (myself included).    It is unlikely we'll be able to completely eliminate a daily cough x 20 years s the use of gabapentin but reasonable to start with 1st gen H1 blockers per guidelines  And since the active ingredient in drixoral is so siminlar to it plus add h2 hs as well as stop the acei for now    Reviewed with pt The standardized cough guidelines published in Chest by Lissa Morales in 2006 are still the best available and consist of a multiple step process (up to 12!) , not a single office visit,  and are intended  to address this problem logically,  with an alogrithm dependent on response to empiric treatment at  each progressive step  to determine a specific diagnosis with  minimal addtional testing needed. Therefore if adherence is an issue or can't be accurately verified,  it's very unlikely the standard evaluation and treatment will be successful here.    Furthermore, response to therapy (other than acute cough suppression, which should only be used short term with avoidance of narcotic containing cough syrups if possible), can be a gradual process for which the patient is not likely to  perceive immediate benefit.  Unlike going to an eye doctor where the best perscription is almost always the first one and is immediately effective, this is almost never the case in the management of chronic cough syndromes. Therefore the patient needs to commit up front to consistently adhere to recommendations  for up to 6 weeks of therapy directed at the likely underlying problem(s) before the response can be reasonably  evaluated.   Total time devoted to counseling  > 50 % of initial 60 min office visit:  review case with pt/ discussion of options/alternatives/ personally creating written customized instructions  in presence of pt  then going over those specific  Instructions directly with the pt including how to use  all of the meds but in particular covering each new medication in detail and the difference between the maintenance= "automatic" meds and the prns using an action plan format for the latter (If this problem/symptom => do that organization reading Left to right).  Please see AVS from this visit for a full list of these instructions which I personally wrote for this pt and  are unique to this visit.

## 2017-04-15 ENCOUNTER — Ambulatory Visit: Payer: Medicare PPO | Admitting: Internal Medicine

## 2017-05-30 DIAGNOSIS — R509 Fever, unspecified: Secondary | ICD-10-CM | POA: Diagnosis not present

## 2017-05-30 DIAGNOSIS — J111 Influenza due to unidentified influenza virus with other respiratory manifestations: Secondary | ICD-10-CM | POA: Diagnosis not present

## 2017-08-16 DIAGNOSIS — M810 Age-related osteoporosis without current pathological fracture: Secondary | ICD-10-CM | POA: Diagnosis not present

## 2017-08-16 DIAGNOSIS — Z0000 Encounter for general adult medical examination without abnormal findings: Secondary | ICD-10-CM | POA: Diagnosis not present

## 2017-08-16 DIAGNOSIS — E782 Mixed hyperlipidemia: Secondary | ICD-10-CM | POA: Diagnosis not present

## 2017-08-16 DIAGNOSIS — I10 Essential (primary) hypertension: Secondary | ICD-10-CM | POA: Diagnosis not present

## 2017-10-10 DIAGNOSIS — M25562 Pain in left knee: Secondary | ICD-10-CM | POA: Diagnosis not present

## 2017-10-10 DIAGNOSIS — M25462 Effusion, left knee: Secondary | ICD-10-CM | POA: Diagnosis not present

## 2017-10-24 DIAGNOSIS — M1712 Unilateral primary osteoarthritis, left knee: Secondary | ICD-10-CM | POA: Diagnosis not present

## 2017-10-24 DIAGNOSIS — M25562 Pain in left knee: Secondary | ICD-10-CM | POA: Diagnosis not present

## 2017-11-07 DIAGNOSIS — M25562 Pain in left knee: Secondary | ICD-10-CM | POA: Diagnosis not present

## 2017-11-11 DIAGNOSIS — M25562 Pain in left knee: Secondary | ICD-10-CM | POA: Diagnosis not present

## 2017-11-17 ENCOUNTER — Other Ambulatory Visit: Payer: Self-pay | Admitting: Orthopedic Surgery

## 2017-11-17 NOTE — Pre-Procedure Instructions (Signed)
Message left for [facility] with Dr. Berenice Primas office to have him sign orders in [facility]

## 2017-11-17 NOTE — Patient Instructions (Addendum)
[name]  11/17/2017   Your procedure is scheduled on: 11/25/2017   Report to [facility] Main  Entrance              Report to admitting at 730AM    Call this number if you have problems the morning of surgery [phone]    Remember: Do not eat food or drink liquids :After Midnight.     Take these medicines the morning of surgery with A SIP OF WATER: ranitidine, lipitor                DO NOT TAKE ANY DIABETIC MEDICATIONS DAY OF YOUR SURGERY                               You may not have any metal on your body including hair pins and              piercings  Do not wear jewelry, make-up, lotions, powders or perfumes, deodorant.              Do not wear nail polish.  Do not shave  48 hours prior to surgery.               Do not bring valuables to the hospital. [place].   Contacts, dentures or bridgework may not be worn into surgery.  Leave suitcase in the car. After surgery it may be brought to your room.     Patients discharged the day of surgery will not be allowed to drive home.  Name and phone number of your driver:  Special Instructions: N/A              Please read over the following fact sheets you were given: _____________________________________________________________________             [facility] - Preparing for Surgery Before surgery, you can play an important role.  Because skin is not sterile, your skin needs to be as free of germs as possible.  You can reduce the number of germs on your skin by washing with CHG (chlorahexidine gluconate) soap before surgery.  CHG is an antiseptic cleaner which kills germs and bonds with the skin to continue killing germs even after washing.  Please DO NOT use if you have an allergy to CHG or antibacterial soaps.  If your skin becomes reddened/irritated stop using the CHG and inform your nurse when you arrive at Short Stay.  Do not shave  (including legs and underarms) for at least 48 hours prior to the first CHG shower.  You may shave your face/neck.  Please follow these instructions carefully:   1.  Shower with CHG Soap the night before surgery and the  morning of Surgery.  2.  If you choose to wash your hair, wash your hair first as usual with your  normal  shampoo.  3.  After you shampoo, rinse your hair and body thoroughly to remove the  shampoo.                                        4.  Use CHG as you would any other liquid  soap.  You can apply chg directly  to the skin and wash                       Gently with a scrungie or clean washcloth.  5.  Apply the CHG Soap to your body ONLY FROM THE NECK DOWN.   Do not use on face/ open                           Wound or open sores. Avoid contact with eyes, ears mouth and genitals (private parts).                       Wash face,  Genitals (private parts) with your normal soap.             6.  Wash thoroughly, paying special attention to the area where your surgery  will be performed.  7.  Thoroughly rinse your body with warm water from the neck down.  8.  DO NOT shower/wash with your normal soap after using and rinsing off  the CHG Soap.             9.  Pat yourself dry with a clean towel.            10.  Wear clean pajamas.            11.  Place clean sheets on your bed the night of your first shower and do not  sleep with pets.  Day of Surgery : Do not apply any lotions/deodorants the morning of surgery.  Please wear clean clothes to the hospital/surgery center.  FAILURE TO FOLLOW THESE INSTRUCTIONS MAY RESULT IN THE CANCELLATION OF YOUR SURGERY PATIENT SIGNATURE_________________________________  NURSE SIGNATURE__________________________________  ________________________________________________________________________

## 2017-11-18 ENCOUNTER — Other Ambulatory Visit: Payer: Self-pay

## 2017-11-18 ENCOUNTER — Encounter (HOSPITAL_COMMUNITY): Payer: Self-pay

## 2017-11-18 ENCOUNTER — Encounter (HOSPITAL_COMMUNITY)
Admission: RE | Admit: 2017-11-18 | Discharge: 2017-11-18 | Disposition: A | Discharge status: Home / Self Care | Payer: Medicare PPO | Source: Ambulatory Visit | Attending: Orthopedic Surgery | Admitting: Orthopedic Surgery

## 2017-11-18 DIAGNOSIS — X58XXXA Exposure to other specified factors, initial encounter: Secondary | ICD-10-CM | POA: Insufficient documentation

## 2017-11-18 DIAGNOSIS — Z01810 Encounter for preprocedural cardiovascular examination: Secondary | ICD-10-CM | POA: Diagnosis not present

## 2017-11-18 DIAGNOSIS — S83242A Other tear of medial meniscus, current injury, left knee, initial encounter: Secondary | ICD-10-CM | POA: Diagnosis not present

## 2017-11-18 HISTORY — DX: Essential (primary) hypertension: I10

## 2017-11-18 HISTORY — DX: Urinary tract infection, site not specified: N39.0

## 2017-11-18 HISTORY — DX: Other specified postprocedural states: R11.2

## 2017-11-18 HISTORY — DX: Other specified postprocedural states: Z98.890

## 2017-11-18 LAB — BASIC METABOLIC PANEL
Anion gap: 8 (ref 5–15)
BUN: 16 mg/dL (ref 8–23)
CHLORIDE: 104 mmol/L (ref 98–111)
CO2: 29 mmol/L (ref 22–32)
Calcium: 9.6 mg/dL (ref 8.9–10.3)
Creatinine, Ser: 0.66 mg/dL (ref 0.44–1.00)
GFR calc Af Amer: >60 mL/min (ref >60)
GFR calc non Af Amer: >60 mL/min (ref >60)
GLUCOSE: 109 mg/dL — AB (ref 70–99)
Potassium: 4.2 mmol/L (ref 3.5–5.1)
Sodium: 141 mmol/L (ref 135–145)

## 2017-11-18 LAB — CBC
HEMATOCRIT: 37.5 % (ref 36.0–46.0)
HEMOGLOBIN: 12.5 g/dL (ref 12.0–15.0)
MCH: 32.1 pg (ref 26.0–34.0)
MCHC: 33.3 g/dL (ref 30.0–36.0)
MCV: 96.4 fL (ref 78.0–100.0)
Platelets: 245 10*3/uL (ref 150–400)
RBC: 3.89 MIL/uL (ref 3.87–5.11)
RDW: 12.9 % (ref 11.5–15.5)
WBC: 5.5 10*3/uL (ref 4.0–10.5)

## 2017-11-18 NOTE — Pre-Procedure Instructions (Signed)
Chest xray 02/16/17 in Epic; office visit Dr. Melvyn Novas 03/03/17 in [place]

## 2017-11-24 ENCOUNTER — Other Ambulatory Visit: Payer: Self-pay | Admitting: Orthopedic Surgery

## 2017-11-24 NOTE — H&P (Addendum)
PREOPERATIVE H&P  Chief Complaint: Left knee pain  HPI: [name] is a 77 y.o. female who presents for evaluation of left knee pain. It has been present for many months and has been worsening. She has failed conservative measures. Pain is rated as moderate.  Past Medical History:  Diagnosis Date  . Arthritis    right arm, bilateral knees, tendonitis right foot  . Chronic cough   . Diverticulosis    left sided  2004  . Esophageal stricture   . GERD (gastroesophageal reflux disease)   . Heart murmur    "slightly closed mitral valve"  . Hypertension   . IBS (irritable bowel syndrome)   . PONV (postoperative nausea and vomiting)   . Rhinitis   . UTI (urinary tract infection)    frequent UTI's past 2 years  . Vasomotor rhinitis    Past Surgical History:  Procedure Laterality Date  . ABDOMINAL HYSTERECTOMY    . APPENDECTOMY    . BRAVO [place] STUDY  03/22/2011   Procedure: BRAVO [place] STUDY;  Surgeon: Inda Castle, MD;  Location: [facility] ENDOSCOPY;  Service: Endoscopy;  Laterality: N/A;  . DILATION AND CURETTAGE OF UTERUS    . ESOPHAGOGASTRODUODENOSCOPY  03/22/2011   Procedure: ESOPHAGOGASTRODUODENOSCOPY (EGD);  Surgeon: Inda Castle, MD;  Location: [facility];  Service: Endoscopy;  Laterality: N/A;   Social History   Socioeconomic History  . Marital status: Married    Spouse name: Not on file  . Number of children: Not on file  . Years of education: Not on file  . Highest education level: Not on file  Occupational History  . Occupation: Housewife  Social Needs  . Financial resource strain: Not on file  . Food insecurity:    Worry: Not on file    Inability: Not on file  . Transportation needs:    Medical: Not on file    Non-medical: Not on file  Tobacco Use  . Smoking status: Never Smoker  . Smokeless tobacco: Never Used  Substance and Sexual Activity  . Alcohol use: Yes    Alcohol/week: 1.8 oz    Types: 3 Glasses of wine per week    Comment: 3/week  . Drug  use: No  . Sexual activity: Not on file  Lifestyle  . Physical activity:    Days per week: Not on file    Minutes per session: Not on file  . Stress: Not on file  Relationships  . Social connections:    Talks on phone: Not on file    Gets together: Not on file    Attends religious service: Not on file    Active member of club or organization: Not on file    Attends meetings of clubs or organizations: Not on file    Relationship status: Not on file  Other Topics Concern  . Not on file  Social History Narrative  . Not on file   Family History  Problem Relation Age of Onset  . Heart disease Father   . Colon cancer Neg Hx    Allergies  Allergen Reactions  . Codeine Nausea And Vomiting   Prior to Admission medications   Medication Sig Start Date End Date Taking? Authorizing Provider  atorvastatin (LIPITOR) 10 MG tablet Take 10 mg daily by mouth.   Yes [provider]  Calcium Carbonate-Vitamin D (CALCIUM 600/VITAMIN D) 600-400 MG-UNIT per chew tablet Chew 1 tablet by mouth daily.     Yes [provider]  Cholecalciferol (VITAMIN  D3) 2000 units TABS Take 2,000 Units by mouth daily.   Yes [provider]  Cyanocobalamin (VITAMIN B-12) 5000 MCG TBDP Take 5,000 mcg by mouth daily.    Yes [provider]  Dexbrompheniramine-Pseudoeph (DRIXORAL PO) Take 2 tablets by mouth daily.   Yes [provider]  diclofenac sodium (VOLTAREN) 1 % GEL Apply 1 application topically daily as needed (for joint pain).   Yes [provider]  diphenhydrAMINE (BENADRYL) 25 mg capsule Take 50 mg by mouth at bedtime as needed for sleep.   Yes [provider]  hydrochlorothiazide (HYDRODIURIL) 25 MG tablet Take 25 mg daily by mouth.   Yes [provider]  ibandronate (BONIVA) 150 MG tablet Take 150 mg by mouth every 30 (thirty) days. Take in the morning with a full glass of water, on an empty stomach, and do not take anything else by mouth  or lie down for the next 30 min.   Yes [provider]  losartan (COZAAR) 50 MG tablet Take 1 tablet (50 mg total) daily by mouth. 03/03/17  Yes Tanda Rockers, MD  ranitidine (ZANTAC) 150 MG tablet Take 150 mg by mouth daily.   Yes [provider]  meclizine (ANTIVERT) 25 MG tablet Take 25 mg by mouth 3 (three) times daily as needed for dizziness or nausea.    [provider]     Positive ROS: None  All other systems have been reviewed and were otherwise negative with the exception of those mentioned in the HPI and as above.  Physical Exam:  Vitals:   11/25/17 0738  BP: (!) 148/67  Pulse: (!) 58  Resp: 16  Temp: 97.8 F (36.6 C)  SpO2: 100%    General: Alert, no acute distress Cardiovascular: No pedal edema Respiratory: No cyanosis, no use of accessory musculature GI: No organomegaly, abdomen is soft and non-tender Skin: No lesions in the area of chief complaint Neurologic: Sensation intact distally Psychiatric: Patient is competent for consent with normal mood and affect Lymphatic: No axillary or cervical lymphadenopathy  MUSCULOSKELETAL: Left knee: Pain over the medial joint line.  Pain with range of motion.  Grinding through range of motion.  MRI: MRI shows significant edema in the medial femoral condyle and medial tibial plateau.  Assessment/Plan: LEFT KNEE MEDIAL MENISCAL TEAR WITH EDEMA IN FEMORAL CONDYLE AND TIBIAL PLATEAU Plan for Procedure(s): LEFT ARTHROSCOPY KNEE WITH MENISECTOMY AND SUBCHONDROPLASTY  The risks benefits and alternatives were discussed with the patient including but not limited to the risks of nonoperative treatment, versus surgical intervention including infection, bleeding, nerve injury, malunion, nonunion, hardware prominence, hardware failure, need for hardware removal, blood clots, cardiopulmonary complications, morbidity, mortality, among others, and they were willing to proceed.  Predicted outcome is good, although  there will be at least a six to nine month expected recovery.  No change in overnight H@P   Alta Corning, MD 11/24/2017 8:41 PM

## 2017-11-25 ENCOUNTER — Encounter (HOSPITAL_COMMUNITY)
Admission: RE | Disposition: A | Discharge status: Home / Self Care | Payer: Self-pay | Source: Ambulatory Visit | Attending: Orthopedic Surgery

## 2017-11-25 ENCOUNTER — Ambulatory Visit (HOSPITAL_COMMUNITY)
Admission: RE | Admit: 2017-11-25 | Discharge: 2017-11-25 | Disposition: A | Discharge status: Home / Self Care | Payer: Medicare PPO | Source: Ambulatory Visit | Attending: Orthopedic Surgery | Admitting: Orthopedic Surgery

## 2017-11-25 ENCOUNTER — Ambulatory Visit (HOSPITAL_COMMUNITY): Payer: Medicare PPO | Admitting: Anesthesiology

## 2017-11-25 ENCOUNTER — Encounter (HOSPITAL_COMMUNITY): Payer: Self-pay | Admitting: Anesthesiology

## 2017-11-25 ENCOUNTER — Ambulatory Visit (HOSPITAL_COMMUNITY): Payer: Medicare PPO

## 2017-11-25 DIAGNOSIS — S82132A Displaced fracture of medial condyle of left tibia, initial encounter for closed fracture: Secondary | ICD-10-CM | POA: Diagnosis present

## 2017-11-25 DIAGNOSIS — R2242 Localized swelling, mass and lump, left lower limb: Secondary | ICD-10-CM | POA: Diagnosis not present

## 2017-11-25 DIAGNOSIS — M170 Bilateral primary osteoarthritis of knee: Secondary | ICD-10-CM | POA: Insufficient documentation

## 2017-11-25 DIAGNOSIS — K589 Irritable bowel syndrome without diarrhea: Secondary | ICD-10-CM | POA: Diagnosis not present

## 2017-11-25 DIAGNOSIS — Z885 Allergy status to narcotic agent status: Secondary | ICD-10-CM | POA: Insufficient documentation

## 2017-11-25 DIAGNOSIS — M84362A Stress fracture, left tibia, initial encounter for fracture: Secondary | ICD-10-CM | POA: Insufficient documentation

## 2017-11-25 DIAGNOSIS — Z419 Encounter for procedure for purposes other than remedying health state, unspecified: Secondary | ICD-10-CM

## 2017-11-25 DIAGNOSIS — M8438XA Stress fracture, other site, initial encounter for fracture: Secondary | ICD-10-CM | POA: Insufficient documentation

## 2017-11-25 DIAGNOSIS — I10 Essential (primary) hypertension: Secondary | ICD-10-CM | POA: Insufficient documentation

## 2017-11-25 DIAGNOSIS — M1712 Unilateral primary osteoarthritis, left knee: Secondary | ICD-10-CM | POA: Diagnosis not present

## 2017-11-25 DIAGNOSIS — E785 Hyperlipidemia, unspecified: Secondary | ICD-10-CM | POA: Insufficient documentation

## 2017-11-25 DIAGNOSIS — R011 Cardiac murmur, unspecified: Secondary | ICD-10-CM | POA: Diagnosis not present

## 2017-11-25 DIAGNOSIS — Z8719 Personal history of other diseases of the digestive system: Secondary | ICD-10-CM | POA: Diagnosis not present

## 2017-11-25 DIAGNOSIS — M94262 Chondromalacia, left knee: Secondary | ICD-10-CM | POA: Diagnosis not present

## 2017-11-25 DIAGNOSIS — M23222 Derangement of posterior horn of medial meniscus due to old tear or injury, left knee: Secondary | ICD-10-CM | POA: Insufficient documentation

## 2017-11-25 DIAGNOSIS — S83242A Other tear of medial meniscus, current injury, left knee, initial encounter: Secondary | ICD-10-CM | POA: Diagnosis present

## 2017-11-25 DIAGNOSIS — K219 Gastro-esophageal reflux disease without esophagitis: Secondary | ICD-10-CM | POA: Insufficient documentation

## 2017-11-25 DIAGNOSIS — M84352A Stress fracture, left femur, initial encounter for fracture: Secondary | ICD-10-CM | POA: Diagnosis not present

## 2017-11-25 DIAGNOSIS — Z79899 Other long term (current) drug therapy: Secondary | ICD-10-CM | POA: Diagnosis not present

## 2017-11-25 DIAGNOSIS — Z8249 Family history of ischemic heart disease and other diseases of the circulatory system: Secondary | ICD-10-CM | POA: Diagnosis not present

## 2017-11-25 DIAGNOSIS — S72435A Nondisplaced fracture of medial condyle of left femur, initial encounter for closed fracture: Secondary | ICD-10-CM | POA: Diagnosis present

## 2017-11-25 HISTORY — PX: KNEE ARTHROSCOPY: SHX127

## 2017-11-25 SURGERY — ARTHROSCOPY, KNEE
Anesthesia: Spinal | Site: Knee | Laterality: Left

## 2017-11-25 MED ORDER — PHENYLEPHRINE HCL 10 MG/ML IJ SOLN
INTRAMUSCULAR | Status: DC | PRN
  Administered 2017-11-25: 80 ug via INTRAVENOUS

## 2017-11-25 MED ORDER — LACTATED RINGERS IV SOLN
INTRAVENOUS | Status: DC
  Administered 2017-11-25 (×2): via INTRAVENOUS

## 2017-11-25 MED ORDER — PHENYLEPHRINE HCL 10 MG/ML IJ SOLN
INTRAMUSCULAR | Status: AC
  Filled 2017-11-25: qty 1

## 2017-11-25 MED ORDER — EPINEPHRINE PF 1 MG/ML IJ SOLN
INTRAMUSCULAR | Status: DC | PRN
  Administered 2017-11-25: 1 mg

## 2017-11-25 MED ORDER — MIDAZOLAM HCL 2 MG/2ML IJ SOLN
INTRAMUSCULAR | Status: AC
  Filled 2017-11-25: qty 2

## 2017-11-25 MED ORDER — SODIUM CHLORIDE 0.9 % IV SOLN
INTRAVENOUS | Status: DC | PRN
  Administered 2017-11-25: 20 ug via INTRAVENOUS

## 2017-11-25 MED ORDER — DEXAMETHASONE SODIUM PHOSPHATE 10 MG/ML IJ SOLN
INTRAMUSCULAR | Status: DC | PRN
  Administered 2017-11-25: 10 mg via INTRAVENOUS

## 2017-11-25 MED ORDER — FENTANYL CITRATE (PF) 100 MCG/2ML IJ SOLN
INTRAMUSCULAR | Status: AC
  Filled 2017-11-25: qty 2

## 2017-11-25 MED ORDER — CEFAZOLIN SODIUM-DEXTROSE 2-4 GM/100ML-% IV SOLN
INTRAVENOUS | Status: AC
  Filled 2017-11-25: qty 100

## 2017-11-25 MED ORDER — MEPERIDINE HCL 50 MG/ML IJ SOLN: 6.2500 mg | INTRAMUSCULAR | Status: DC | PRN

## 2017-11-25 MED ORDER — ONDANSETRON HCL 4 MG/2ML IJ SOLN
INTRAMUSCULAR | Status: AC
  Filled 2017-11-25: qty 2

## 2017-11-25 MED ORDER — DEXAMETHASONE SODIUM PHOSPHATE 10 MG/ML IJ SOLN
INTRAMUSCULAR | Status: AC
  Filled 2017-11-25: qty 1

## 2017-11-25 MED ORDER — PROPOFOL 10 MG/ML IV BOLUS
INTRAVENOUS | Status: AC
  Filled 2017-11-25: qty 60

## 2017-11-25 MED ORDER — LACTATED RINGERS IR SOLN
Status: DC | PRN
  Administered 2017-11-25: 3000 mL

## 2017-11-25 MED ORDER — LIDOCAINE 2% (20 MG/ML) 5 ML SYRINGE
INTRAMUSCULAR | Status: AC
  Filled 2017-11-25: qty 5

## 2017-11-25 MED ORDER — MIDAZOLAM HCL 5 MG/5ML IJ SOLN
INTRAMUSCULAR | Status: DC | PRN
  Administered 2017-11-25: 1 mg via INTRAVENOUS

## 2017-11-25 MED ORDER — BUPIVACAINE HCL (PF) 0.5 % IJ SOLN
INTRAMUSCULAR | Status: AC
  Filled 2017-11-25: qty 30

## 2017-11-25 MED ORDER — CHLORHEXIDINE GLUCONATE 4 % EX LIQD: 60.0000 mL | Freq: Once | CUTANEOUS | Status: DC

## 2017-11-25 MED ORDER — HYDROCODONE-ACETAMINOPHEN 5-325 MG PO TABS: 1.0000 | ORAL_TABLET | ORAL | 0 refills | Status: DC | PRN

## 2017-11-25 MED ORDER — EPINEPHRINE PF 1 MG/ML IJ SOLN
INTRAMUSCULAR | Status: AC
  Filled 2017-11-25: qty 2

## 2017-11-25 MED ORDER — FENTANYL CITRATE (PF) 100 MCG/2ML IJ SOLN
INTRAMUSCULAR | Status: DC | PRN
  Administered 2017-11-25 (×2): 50 ug via INTRAVENOUS

## 2017-11-25 MED ORDER — ONDANSETRON HCL 4 MG/2ML IJ SOLN
INTRAMUSCULAR | Status: DC | PRN
  Administered 2017-11-25: 4 mg via INTRAVENOUS

## 2017-11-25 MED ORDER — METOCLOPRAMIDE HCL 5 MG/ML IJ SOLN: 10.0000 mg | Freq: Once | INTRAMUSCULAR | Status: DC | PRN

## 2017-11-25 MED ORDER — PHENYLEPHRINE HCL 10 MG/ML IJ SOLN
INTRAMUSCULAR | Status: DC | PRN
  Administered 2017-11-25: 20 ug/min via INTRAVENOUS
  Administered 2017-11-25: 10 ug/min via INTRAVENOUS

## 2017-11-25 MED ORDER — FENTANYL CITRATE (PF) 100 MCG/2ML IJ SOLN: 25.0000 ug | INTRAMUSCULAR | Status: DC | PRN

## 2017-11-25 MED ORDER — BUPIVACAINE HCL (PF) 0.5 % IJ SOLN
INTRAMUSCULAR | Status: DC | PRN
  Administered 2017-11-25: 15 mL

## 2017-11-25 MED ORDER — BUPIVACAINE IN DEXTROSE 0.75-8.25 % IT SOLN
INTRATHECAL | Status: DC | PRN
  Administered 2017-11-25: 1.6 mL via INTRATHECAL

## 2017-11-25 MED ORDER — PROPOFOL 500 MG/50ML IV EMUL
INTRAVENOUS | Status: DC | PRN
  Administered 2017-11-25: 50 ug/kg/min via INTRAVENOUS

## 2017-11-25 MED ORDER — CEFAZOLIN SODIUM-DEXTROSE 2-4 GM/100ML-% IV SOLN
2.0000 g | INTRAVENOUS | Status: AC
  Administered 2017-11-25: 2 g via INTRAVENOUS

## 2017-11-25 MED ORDER — PHENYLEPHRINE 40 MCG/ML (10ML) SYRINGE FOR IV PUSH (FOR BLOOD PRESSURE SUPPORT)
PREFILLED_SYRINGE | INTRAVENOUS | Status: AC
  Filled 2017-11-25: qty 10

## 2017-11-25 SURGICAL SUPPLY — 35 items
ACCUMIX MIXING SYSTEM ×3 IMPLANT
BANDAGE ACE 6X5 VEL STRL LF (GAUZE/BANDAGES/DRESSINGS) ×3 IMPLANT
BLADE 4.2CUDA (BLADE) ×3 IMPLANT
BLADE CUTTER GATOR 3.5 (BLADE) IMPLANT
BLADE GREAT WHITE 4.2 (BLADE) IMPLANT
BLADE GREAT WHITE 4.2MM (BLADE)
BLADE SURG SZ11 CARB STEEL (BLADE) IMPLANT
BNDG GAUZE ELAST 4 BULKY (GAUZE/BANDAGES/DRESSINGS) ×3 IMPLANT
BOOTIES KNEE HIGH SLOAN (MISCELLANEOUS) ×3 IMPLANT
COVER SURGICAL LIGHT HANDLE (MISCELLANEOUS) ×3 IMPLANT
DRAPE C-ARM 42X120 X-RAY (DRAPES) ×3 IMPLANT
DRAPE C-ARMOR (DRAPES) ×3 IMPLANT
DRAPE SHEET LG 3/4 BI-LAMINATE (DRAPES) ×3 IMPLANT
DRSG EMULSION OIL 3X16 NADH (GAUZE/BANDAGES/DRESSINGS) ×3 IMPLANT
DRSG EMULSION OIL 3X3 NADH (GAUZE/BANDAGES/DRESSINGS) ×3 IMPLANT
DRSG PAD ABDOMINAL 8X10 ST (GAUZE/BANDAGES/DRESSINGS) ×3 IMPLANT
DURAPREP 26ML APPLICATOR (WOUND CARE) ×3 IMPLANT
GAUZE SPONGE 4X4 12PLY STRL (GAUZE/BANDAGES/DRESSINGS) ×3 IMPLANT
GLOVE ECLIPSE 7.5 STRL STRAW (GLOVE) ×6 IMPLANT
GLOVE SURG SS PI 6.5 STRL IVOR (GLOVE) ×3 IMPLANT
IV LACTATED RINGER IRRG 3000ML (IV SOLUTION) ×10
IV LR IRRIG 3000ML ARTHROMATIC (IV SOLUTION) ×5 IMPLANT
KIT BASIN OR (CUSTOM PROCEDURE TRAY) IMPLANT
KIT MIXER ACCUMIX (KITS) ×6 IMPLANT
KNEE KIT SCP W/SIDE ACCUPORT (Joint) ×6 IMPLANT
LAWSON 128188 ACCUPORT CANNULA 11ga. x 120mm - Side ×3 IMPLANT
MANIFOLD NEPTUNE II (INSTRUMENTS) ×6 IMPLANT
PACK ARTHROSCOPY WL (CUSTOM PROCEDURE TRAY) ×3 IMPLANT
PACK ICE MAXI GEL EZY WRAP (MISCELLANEOUS) ×9 IMPLANT
PAD ABD 8X10 STRL (GAUZE/BANDAGES/DRESSINGS) ×6 IMPLANT
PADDING CAST COTTON 6X4 STRL (CAST SUPPLIES) ×3 IMPLANT
POSITIONER SURGICAL ARM (MISCELLANEOUS) ×6 IMPLANT
SUT ETHILON 4 0 PS 2 18 (SUTURE) ×3 IMPLANT
TUBING ARTHRO INFLOW-ONLY STRL (TUBING) ×3 IMPLANT
WRAP KNEE MAXI GEL POST OP (GAUZE/BANDAGES/DRESSINGS) ×3 IMPLANT

## 2017-11-25 NOTE — Anesthesia Preprocedure Evaluation (Addendum)
Anesthesia Evaluation  Patient identified by MRN, date of birth, ID band Patient awake    Reviewed: Allergy & Precautions, NPO status , Patient's Chart, lab work & pertinent test results  History of Anesthesia Complications (+) PONV and history of anesthetic complications  Airway Mallampati: II  TM Distance: >3 FB Neck ROM: Full    Dental no notable dental hx. (+) Teeth Intact   Pulmonary  Chronic cough Vasomotor rhinitis   Pulmonary exam normal breath sounds clear to auscultation       Cardiovascular hypertension, Pt. on medications Normal cardiovascular exam+ Valvular Problems/Murmurs  Rhythm:Regular Rate:Normal     Neuro/Psych negative neurological ROS  negative psych ROS   GI/Hepatic Neg liver ROS, GERD  Medicated and Controlled,Hx/o esophageal stricture IBS Diverticulosis   Endo/Other  Hyperlipidemia  Renal/GU negative Renal ROS  negative genitourinary   Musculoskeletal  (+) Arthritis , Torn medial meniscus left knee   Abdominal   Peds  Hematology negative hematology ROS (+)   Anesthesia Other Findings   Reproductive/Obstetrics                           Anesthesia Physical Anesthesia Plan  ASA: II  Anesthesia Plan: Spinal   Post-op Pain Management:    Induction: Intravenous  PONV Risk Score and Plan: 4 or greater and Ondansetron, Dexamethasone, Treatment may vary due to age or medical condition and Propofol infusion  Airway Management Planned: Natural Airway and Nasal Cannula  Additional Equipment:   Intra-op Plan:   Post-operative Plan:   Informed Consent: I have reviewed the patients History and Physical, chart, labs and discussed the procedure including the risks, benefits and alternatives for the proposed anesthesia with the patient or authorized representative who has indicated his/her understanding and acceptance.   Dental advisory given  Plan Discussed  with: CRNA and Surgeon  Anesthesia Plan Comments:         Anesthesia Quick Evaluation

## 2017-11-25 NOTE — Discharge Instructions (Signed)
POST-OP KNEE ARTHROSCOPY INSTRUCTIONS  °Dr. John Graves/Jim Rhythm Gubbels PA-C ° °Pain °You will be expected to have a moderate amount of pain in the affected knee for approximately two weeks. However, the first two days will be the most severe pain. A prescription has been provided to take as needed for the pain. The pain can be reduced by applying ice packs to the knee for the first 1-2 weeks post surgery. Also, keeping the leg elevated on pillows will help alleviate the pain. If you develop any acute pain or swelling in your calf muscle, please call the doctor. ° °Activity °It is preferred that you stay at bed rest for approximately 24 hours. However, you may go to the bathroom with help. Weight bearing as tolerated. You may begin the knee exercises the day of surgery. Discontinue crutches as the knee pain resolves. ° °Dressing °Keep the dressing dry. If the ace bandage should wrinkle or roll up, this can be rewrapped to prevent ridges in the bandage. You may remove all dressings in 48 hours,  apply bandaids to each wound. You may shower on the 4th day after surgery but no tub bath. ° °Symptoms to report to your doctor °Extreme pain °Extreme swelling °Temperature above 101 degrees °Change in the feeling, color, or movement of your toes °Redness, heat, or swelling at your incision ° °Exercise °If is preferred that as soon as possible you try to do a straight leg raise without bending the knee and concentrate on bringing the heel of your foot off the bed up to approximately 45 degrees and hold for the count of 10 seconds. Repeat this at least 10 times three or four times per day. Additional exercises are provided below. ° °You are encouraged to bend the knee as tolerated. ° °Follow-Up °Call to schedule a follow-up appointment in 5-7 days. Our office # is [phone]. ° °POST-OP EXERCISES ° °Short Arc Quads ° °1. Lie on back with legs straight. Place towel roll under thigh, just above knee. °2. Tighten thigh muscles to  straighten knee and lift heel off bed. °3. Hold for slow count of five, then lower. °4. Do three sets of ten ° ° ° °Straight Leg Raises ° °1. Lie on back with operative leg straight and other leg bent. °2. Keeping operative leg completely straight, slowly lift operative leg so foot is 5 inches off bed. °3. Hold for slow count of five, then lower. °4. Do three sets of ten. ° ° ° °DO BOTH EXERCISES 2 TIMES A DAY ° °Ankle Pumps ° °Work/move the operative ankle and foot up and down 10 times every hour while awake. °

## 2017-11-25 NOTE — Transfer of Care (Signed)
Immediate Anesthesia Transfer of Care Note  Patient: [name]  Procedure(s) Performed: LEFT ARTHROSCOPY KNEE WITH PATRIAL MEDIAL MENISECTOMY AND MEDIAL AND LATERAL SUBCHONDROPLASTY. Treatment of femoral condylar medial fracture and tibia condylar fracture. (Left Knee)  Patient Location: PACU  Anesthesia Type:Spinal  Level of Consciousness: awake, alert  and oriented  Airway & Oxygen Therapy: Patient Spontanous Breathing and Patient connected to face mask oxygen  Post-op Assessment: Report given to RN and Post -op Vital signs reviewed and stable  Post vital signs: Reviewed and stable  Last Vitals:  Vitals Value Taken Time  BP    Temp    Pulse 57 11/25/2017 11:51 AM  Resp 14 11/25/2017 11:51 AM  SpO2 100 % 11/25/2017 11:51 AM  Vitals shown include unvalidated device data.  Last Pain:  Vitals:   11/25/17 0755  TempSrc:   PainSc: 0-No pain      Patients Stated Pain Goal: 4 (80/03/49 1791)  Complications: No apparent anesthesia complications

## 2017-11-25 NOTE — Op Note (Signed)
[name], [name] MEDICAL RECORD YO:[id number] ACCOUNT [id number] DATE OF BIRTH:05-03-1940 FACILITY: [facility] LOCATION: [facility] PHYSICIAN:Byrdie Miyazaki Maudie Mercury, MD  OPERATIVE REPORT  DATE OF PROCEDURE:  11/25/2017  She is a 77 year old female in the orthopedic surgery.   PREOPERATIVE DIAGNOSES: 1.  Medial meniscal tear. 2.  Medial femoral condylar stress fracture. 3.  Medial tibial plateau stress fracture.  POSTOPERATIVE DIAGNOSES: 1.  Medial meniscal tear. 2.  Medial femoral condylar stress fracture. 3.  Medial tibial plateau stress fracture. 4.  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  PROCEDURE: 1.  Operative knee arthroscopy with partial medial meniscectomy, left knee. 2.  Chondroplasty medial femoral condyle and lateral tibial plateau, left knee. 3.  Open treatment of medial femoral condylar fracture with 5 mL of calcium phosphate cement. 4.  Open treatment of medial tibial plateau stress fracture with 4 mL of calcium phosphate cement. 5.  Interpretation of multiple intraoperative fluoroscopic images.    SURGEON:  Dorna Leitz, MD  ASSISTANT:  Gaspar Skeeters, PA  ANESTHESIA:  Spinal.  BRIEF HISTORY:  The patient is a 77 year old female with a long history of significant complaints of left knee pain.  We evaluated her in the office where x-rays showed no significant joint space narrowing.  MRI showed a complex posterior horn medial  meniscal tear and significant subchondral edema in the medial femur and medial tibial plateau.  We talked about treatment options including the possibility of super aggressive treatment of unicompartmental knee replacement versus arthroscopy.  I felt if  we were going to do arthroscopy, a subchondroplasty would be appropriate, and she was brought to the operating room for these procedures.  DESCRIPTION OF PROCEDURE:  The patient was brought to the operating room.  After adequate anesthesia was obtained  with a spinal anesthetic, the patient was placed supine on the operating table.  The left leg was prepped and draped in the usual sterile  fashion.  Following this, routine arthroscopic examination of knee revealed there was a posterior horn medial meniscal tear.  This was debrided back to a smooth and stable rim with a combination of straight-biting forceps and upbiting forceps.  Once this  was done, the meniscal rim was contoured down.  Attention was then turned to the medial femoral condyle which had some grade II chondromalacia, which was debrided back to a smooth and stable rim.  Attention was then turned towards the notch, which was  normal.  Attention was turned laterally where there was some grade II and grade III changes on the lateral tibial plateau, which were debrided back to a smooth stable rim of articular cartilage.  Attention was then turned out of the joint.  The  patellofemoral joint was identified on the way and had some mild chondromalacia, not significant.  Attention was turned out of the joint at this point, and under fluoroscopic guidance with multiple sequencing of AP and lateral fluoroscopic images, we  drilled a cannula into the area of femoral condylar stress fracture on the medial femoral condyle.  Once this was done, we put 5 mL of calcium phosphate cement into the bone and a small amount of extravasation on the last syringe.  We waited 10 minutes  for this to dry and then removed the cannula.  While it was drying, we went to the tibial plateau, and with multiple adjustments in AP and lateral fluoroscopy, we ultimately were able to drill in a cannula and just slightly more anterior to the tibial  plateau.  This was injected with calcium phosphate cement, about 4 mL.  That is all we could get in on this side.  At this point, the final images were taken fluoroscopically.  We waited 12 minutes and then removed that cannula as well.  We put the  camera back into the knee and  irrigated extensively and thoroughly and took a final check.  There was no bone cement in the knee at this point.  At this point, the wounds were irrigated, suctioned dry and closed with a sterile compressive dressing and  some Marcaine around the portals and into the joint.  LN/NUANCE  D:11/25/2017 T:11/25/2017 JOB:[id number]

## 2017-11-25 NOTE — Anesthesia Postprocedure Evaluation (Signed)
Anesthesia Post Note  Patient: [name]  Procedure(s) Performed: LEFT ARTHROSCOPY KNEE WITH PATRIAL MEDIAL MENISECTOMY AND MEDIAL AND LATERAL SUBCHONDROPLASTY. Treatment of femoral condylar medial fracture and tibia condylar fracture. (Left Knee)     Patient location during evaluation: PACU Anesthesia Type: Spinal Level of consciousness: oriented and awake and alert Pain management: pain level controlled Vital Signs Assessment: post-procedure vital signs reviewed and stable Respiratory status: spontaneous breathing, respiratory function stable and nonlabored ventilation Cardiovascular status: blood pressure returned to baseline and stable Postop Assessment: no headache, no backache, no apparent nausea or vomiting, patient able to bend at knees and spinal receding Anesthetic complications: no    Last Vitals:  Vitals:   11/25/17 1230 11/25/17 1245  BP: 140/83 (!) 149/78  Pulse: (!) 57 (!) 40  Resp: 14 16  Temp: (!) 36.3 C (!) 36.4 C  SpO2: 98% 97%    Last Pain:  Vitals:   11/25/17 1245  TempSrc:   PainSc: 0-No pain                 Arif Amendola A.

## 2017-11-25 NOTE — Anesthesia Procedure Notes (Signed)
Procedure Name: MAC Date/Time: 11/25/2017 10:15 AM Performed by: Lissa Morales, CRNA Pre-anesthesia Checklist: Patient identified, Emergency Drugs available, Suction available, Patient being monitored and Timeout performed Patient Re-evaluated:Patient Re-evaluated prior to induction Oxygen Delivery Method: Simple face mask Placement Confirmation: positive ETCO2

## 2017-11-25 NOTE — Brief Op Note (Signed)
11/25/2017  11:49 AM  PATIENT:  Lowella Dandy  77 y.o. female  PRE-OPERATIVE DIAGNOSIS:  LEFT KNEE MEDIAL MENISCAL TEAR WITH EDEMA IN FEMORAL CONDYLE AND TIBIAL PLATEAU  POST-OPERATIVE DIAGNOSIS:  LEFT KNEE MEDIAL MENISCAL TEAR WITH EDEMA IN FEMORAL CONDYLE AND TIBIAL PLATEAU  PROCEDURE:  Procedure(s) with comments: LEFT ARTHROSCOPY KNEE WITH PATRIAL MEDIAL MENISECTOMY AND MEDIAL AND LATERAL SUBCHONDROPLASTY. Treatment of femoral condylar medial fracture and tibia condylar fracture. (Left) - REQUESTING 90 MINS.FOR CASE  SURGEON:  Surgeon(s) and Role:    * Dorna Leitz, MD - Primary  PHYSICIAN ASSISTANT:   ASSISTANTS: bethune   ANESTHESIA:   spinal  EBL:  none   BLOOD ADMINISTERED:none  DRAINS: none   LOCAL MEDICATIONS USED:  MARCAINE     SPECIMEN:  No Specimen  DISPOSITION OF SPECIMEN:  N/A  COUNTS:  YES  TOURNIQUET:  * No tourniquets in log *  DICTATION: .Other Dictation: Dictation Number [id number]  PLAN OF CARE: Discharge to home after PACU  PATIENT DISPOSITION:  PACU - hemodynamically stable.   Delay start of Pharmacological VTE agent (>24hrs) due to surgical blood loss or risk of bleeding: no

## 2017-11-25 NOTE — Anesthesia Procedure Notes (Addendum)
Spinal  Patient location during procedure: OR Start time: 11/25/2017 10:14 AM Staffing Anesthesiologist: Josephine Igo, MD Performed: anesthesiologist  Preanesthetic Checklist Completed: patient identified, site marked, surgical consent, pre-op evaluation, timeout performed, IV checked, risks and benefits discussed and monitors and equipment checked Spinal Block Patient position: sitting Prep: site prepped and draped and DuraPrep Patient monitoring: heart rate, cardiac monitor, continuous pulse ox and blood pressure Approach: midline Location: L4-5 Injection technique: single-shot Needle Needle type: Pencan  Needle gauge: 24 G Needle length: 9 cm Assessment Sensory level: T4 Additional Notes Patient tolerated procedure well. Adequate sensory level.

## 2017-11-28 ENCOUNTER — Encounter (HOSPITAL_COMMUNITY): Payer: Self-pay | Admitting: Orthopedic Surgery

## 2017-12-01 DIAGNOSIS — Z9889 Other specified postprocedural states: Secondary | ICD-10-CM | POA: Diagnosis not present

## 2017-12-02 DIAGNOSIS — M25662 Stiffness of left knee, not elsewhere classified: Secondary | ICD-10-CM | POA: Diagnosis not present

## 2017-12-02 DIAGNOSIS — R262 Difficulty in walking, not elsewhere classified: Secondary | ICD-10-CM | POA: Diagnosis not present

## 2017-12-02 DIAGNOSIS — M6281 Muscle weakness (generalized): Secondary | ICD-10-CM | POA: Diagnosis not present

## 2017-12-05 DIAGNOSIS — R262 Difficulty in walking, not elsewhere classified: Secondary | ICD-10-CM | POA: Diagnosis not present

## 2017-12-05 DIAGNOSIS — M6281 Muscle weakness (generalized): Secondary | ICD-10-CM | POA: Diagnosis not present

## 2017-12-05 DIAGNOSIS — M25662 Stiffness of left knee, not elsewhere classified: Secondary | ICD-10-CM | POA: Diagnosis not present

## 2017-12-09 DIAGNOSIS — M6281 Muscle weakness (generalized): Secondary | ICD-10-CM | POA: Diagnosis not present

## 2017-12-09 DIAGNOSIS — M25662 Stiffness of left knee, not elsewhere classified: Secondary | ICD-10-CM | POA: Diagnosis not present

## 2017-12-09 DIAGNOSIS — R262 Difficulty in walking, not elsewhere classified: Secondary | ICD-10-CM | POA: Diagnosis not present

## 2017-12-19 DIAGNOSIS — M6281 Muscle weakness (generalized): Secondary | ICD-10-CM | POA: Diagnosis not present

## 2017-12-19 DIAGNOSIS — R262 Difficulty in walking, not elsewhere classified: Secondary | ICD-10-CM | POA: Diagnosis not present

## 2017-12-19 DIAGNOSIS — M25662 Stiffness of left knee, not elsewhere classified: Secondary | ICD-10-CM | POA: Diagnosis not present

## 2017-12-22 DIAGNOSIS — Z9889 Other specified postprocedural states: Secondary | ICD-10-CM | POA: Diagnosis not present

## 2017-12-28 DIAGNOSIS — M6281 Muscle weakness (generalized): Secondary | ICD-10-CM | POA: Diagnosis not present

## 2017-12-28 DIAGNOSIS — M25662 Stiffness of left knee, not elsewhere classified: Secondary | ICD-10-CM | POA: Diagnosis not present

## 2017-12-28 DIAGNOSIS — R262 Difficulty in walking, not elsewhere classified: Secondary | ICD-10-CM | POA: Diagnosis not present

## 2018-01-10 DIAGNOSIS — M6281 Muscle weakness (generalized): Secondary | ICD-10-CM | POA: Diagnosis not present

## 2018-01-10 DIAGNOSIS — M25662 Stiffness of left knee, not elsewhere classified: Secondary | ICD-10-CM | POA: Diagnosis not present

## 2018-01-10 DIAGNOSIS — R262 Difficulty in walking, not elsewhere classified: Secondary | ICD-10-CM | POA: Diagnosis not present

## 2018-01-12 DIAGNOSIS — M1712 Unilateral primary osteoarthritis, left knee: Secondary | ICD-10-CM | POA: Diagnosis not present

## 2018-01-12 DIAGNOSIS — M1711 Unilateral primary osteoarthritis, right knee: Secondary | ICD-10-CM | POA: Diagnosis not present

## 2018-01-13 ENCOUNTER — Other Ambulatory Visit: Payer: Self-pay | Admitting: Family Medicine

## 2018-01-13 DIAGNOSIS — Z1231 Encounter for screening mammogram for malignant neoplasm of breast: Secondary | ICD-10-CM

## 2018-01-17 DIAGNOSIS — Z23 Encounter for immunization: Secondary | ICD-10-CM | POA: Diagnosis not present

## 2018-01-27 DIAGNOSIS — D225 Melanocytic nevi of trunk: Secondary | ICD-10-CM | POA: Diagnosis not present

## 2018-01-27 DIAGNOSIS — Z1283 Encounter for screening for malignant neoplasm of skin: Secondary | ICD-10-CM | POA: Diagnosis not present

## 2018-02-14 DIAGNOSIS — N390 Urinary tract infection, site not specified: Secondary | ICD-10-CM | POA: Diagnosis not present

## 2018-02-14 DIAGNOSIS — R350 Frequency of micturition: Secondary | ICD-10-CM | POA: Diagnosis not present

## 2018-02-21 DIAGNOSIS — M1712 Unilateral primary osteoarthritis, left knee: Secondary | ICD-10-CM | POA: Diagnosis not present

## 2018-02-22 ENCOUNTER — Ambulatory Visit
Admission: RE | Admit: 2018-02-22 | Discharge: 2018-02-22 | Disposition: A | Discharge status: Home / Self Care | Payer: Medicare PPO | Source: Ambulatory Visit | Attending: Family Medicine | Admitting: Family Medicine

## 2018-02-22 DIAGNOSIS — Z1231 Encounter for screening mammogram for malignant neoplasm of breast: Secondary | ICD-10-CM | POA: Diagnosis not present

## 2018-02-23 ENCOUNTER — Encounter (HOSPITAL_COMMUNITY): Payer: Self-pay | Admitting: Orthopedic Surgery

## 2018-02-23 DIAGNOSIS — M25562 Pain in left knee: Secondary | ICD-10-CM | POA: Diagnosis not present

## 2018-02-23 DIAGNOSIS — M810 Age-related osteoporosis without current pathological fracture: Secondary | ICD-10-CM | POA: Diagnosis not present

## 2018-02-23 DIAGNOSIS — I10 Essential (primary) hypertension: Secondary | ICD-10-CM | POA: Diagnosis not present

## 2018-02-23 DIAGNOSIS — E782 Mixed hyperlipidemia: Secondary | ICD-10-CM | POA: Diagnosis not present

## 2018-03-02 DIAGNOSIS — M1712 Unilateral primary osteoarthritis, left knee: Secondary | ICD-10-CM | POA: Diagnosis not present

## 2018-03-09 DIAGNOSIS — M1712 Unilateral primary osteoarthritis, left knee: Secondary | ICD-10-CM | POA: Diagnosis not present

## 2018-03-28 DIAGNOSIS — M1712 Unilateral primary osteoarthritis, left knee: Secondary | ICD-10-CM | POA: Diagnosis not present

## 2018-05-03 DIAGNOSIS — R05 Cough: Secondary | ICD-10-CM | POA: Diagnosis not present

## 2018-06-01 DIAGNOSIS — N390 Urinary tract infection, site not specified: Secondary | ICD-10-CM | POA: Diagnosis not present

## 2018-06-01 DIAGNOSIS — R300 Dysuria: Secondary | ICD-10-CM | POA: Diagnosis not present

## 2018-08-21 DIAGNOSIS — Z0000 Encounter for general adult medical examination without abnormal findings: Secondary | ICD-10-CM | POA: Diagnosis not present

## 2018-08-21 DIAGNOSIS — R7309 Other abnormal glucose: Secondary | ICD-10-CM | POA: Diagnosis not present

## 2018-08-21 DIAGNOSIS — E782 Mixed hyperlipidemia: Secondary | ICD-10-CM | POA: Diagnosis not present

## 2018-08-23 DIAGNOSIS — Z1211 Encounter for screening for malignant neoplasm of colon: Secondary | ICD-10-CM | POA: Diagnosis not present

## 2018-08-23 DIAGNOSIS — M810 Age-related osteoporosis without current pathological fracture: Secondary | ICD-10-CM | POA: Diagnosis not present

## 2018-08-23 DIAGNOSIS — F419 Anxiety disorder, unspecified: Secondary | ICD-10-CM | POA: Diagnosis not present

## 2018-08-23 DIAGNOSIS — I10 Essential (primary) hypertension: Secondary | ICD-10-CM | POA: Diagnosis not present

## 2018-08-23 DIAGNOSIS — R42 Dizziness and giddiness: Secondary | ICD-10-CM | POA: Diagnosis not present

## 2018-08-23 DIAGNOSIS — E782 Mixed hyperlipidemia: Secondary | ICD-10-CM | POA: Diagnosis not present

## 2018-08-23 DIAGNOSIS — Z0000 Encounter for general adult medical examination without abnormal findings: Secondary | ICD-10-CM | POA: Diagnosis not present

## 2018-08-29 ENCOUNTER — Other Ambulatory Visit: Payer: Self-pay | Admitting: Family Medicine

## 2018-08-29 DIAGNOSIS — M810 Age-related osteoporosis without current pathological fracture: Secondary | ICD-10-CM

## 2018-09-05 ENCOUNTER — Other Ambulatory Visit: Payer: Self-pay | Admitting: Family Medicine

## 2018-09-05 DIAGNOSIS — Z1231 Encounter for screening mammogram for malignant neoplasm of breast: Secondary | ICD-10-CM

## 2018-11-18 DIAGNOSIS — N390 Urinary tract infection, site not specified: Secondary | ICD-10-CM | POA: Diagnosis not present

## 2018-11-20 DIAGNOSIS — H9202 Otalgia, left ear: Secondary | ICD-10-CM | POA: Diagnosis not present

## 2018-12-14 DIAGNOSIS — M1711 Unilateral primary osteoarthritis, right knee: Secondary | ICD-10-CM | POA: Diagnosis not present

## 2018-12-14 DIAGNOSIS — M1712 Unilateral primary osteoarthritis, left knee: Secondary | ICD-10-CM | POA: Diagnosis not present

## 2018-12-14 DIAGNOSIS — M1710 Unilateral primary osteoarthritis, unspecified knee: Secondary | ICD-10-CM | POA: Diagnosis not present

## 2019-01-09 DIAGNOSIS — Z23 Encounter for immunization: Secondary | ICD-10-CM | POA: Diagnosis not present

## 2019-01-09 DIAGNOSIS — R399 Unspecified symptoms and signs involving the genitourinary system: Secondary | ICD-10-CM | POA: Diagnosis not present

## 2019-02-26 ENCOUNTER — Ambulatory Visit
Admission: RE | Admit: 2019-02-26 | Discharge: 2019-02-26 | Disposition: A | Discharge status: Home / Self Care | Payer: Medicare PPO | Source: Ambulatory Visit | Attending: Family Medicine | Admitting: Family Medicine

## 2019-02-26 ENCOUNTER — Other Ambulatory Visit: Payer: Self-pay

## 2019-02-26 DIAGNOSIS — Z1231 Encounter for screening mammogram for malignant neoplasm of breast: Secondary | ICD-10-CM | POA: Diagnosis not present

## 2019-02-26 DIAGNOSIS — M85851 Other specified disorders of bone density and structure, right thigh: Secondary | ICD-10-CM | POA: Diagnosis not present

## 2019-02-26 DIAGNOSIS — Z780 Asymptomatic menopausal state: Secondary | ICD-10-CM | POA: Diagnosis not present

## 2019-02-26 DIAGNOSIS — M810 Age-related osteoporosis without current pathological fracture: Secondary | ICD-10-CM

## 2019-03-02 DIAGNOSIS — R42 Dizziness and giddiness: Secondary | ICD-10-CM | POA: Diagnosis not present

## 2019-03-02 DIAGNOSIS — I10 Essential (primary) hypertension: Secondary | ICD-10-CM | POA: Diagnosis not present

## 2019-03-02 DIAGNOSIS — E782 Mixed hyperlipidemia: Secondary | ICD-10-CM | POA: Diagnosis not present

## 2019-05-17 DIAGNOSIS — Z1283 Encounter for screening for malignant neoplasm of skin: Secondary | ICD-10-CM | POA: Diagnosis not present

## 2019-05-17 DIAGNOSIS — D225 Melanocytic nevi of trunk: Secondary | ICD-10-CM | POA: Diagnosis not present

## 2019-05-17 DIAGNOSIS — R208 Other disturbances of skin sensation: Secondary | ICD-10-CM | POA: Diagnosis not present

## 2019-05-17 DIAGNOSIS — X32XXXD Exposure to sunlight, subsequent encounter: Secondary | ICD-10-CM | POA: Diagnosis not present

## 2019-05-17 DIAGNOSIS — L570 Actinic keratosis: Secondary | ICD-10-CM | POA: Diagnosis not present

## 2019-05-24 ENCOUNTER — Ambulatory Visit (INDEPENDENT_AMBULATORY_CARE_PROVIDER_SITE_OTHER): Payer: Medicare HMO

## 2019-05-24 ENCOUNTER — Ambulatory Visit: Payer: Medicare HMO | Admitting: Podiatry

## 2019-05-24 ENCOUNTER — Other Ambulatory Visit: Payer: Self-pay | Admitting: Podiatry

## 2019-05-24 ENCOUNTER — Other Ambulatory Visit: Payer: Self-pay

## 2019-05-24 ENCOUNTER — Encounter: Payer: Self-pay | Admitting: Podiatry

## 2019-05-24 VITALS — BP 158/81 | HR 59 | Temp 98.2°F

## 2019-05-24 DIAGNOSIS — M79671 Pain in right foot: Secondary | ICD-10-CM

## 2019-05-24 DIAGNOSIS — M779 Enthesopathy, unspecified: Secondary | ICD-10-CM

## 2019-05-24 DIAGNOSIS — M722 Plantar fascial fibromatosis: Secondary | ICD-10-CM | POA: Diagnosis not present

## 2019-05-24 DIAGNOSIS — M79672 Pain in left foot: Secondary | ICD-10-CM

## 2019-05-24 DIAGNOSIS — M7751 Other enthesopathy of right foot: Secondary | ICD-10-CM

## 2019-05-25 NOTE — Progress Notes (Signed)
Subjective:   Patient ID: [name], female   DOB: 79 y.o.   MRN: [id number]   HPI Patient states on my left foot I have this mass underneath it that I was concerned about and wanted to get checked in on the right I have got a lot of forefoot pain that makes it hard for me to walk and states that at times it can be 10/10 and I need to walk for exercise.  The patient does not smoke likes to be active   Review of Systems  All other systems reviewed and are negative.       Objective:  Physical Exam Vitals and nursing note reviewed.  Constitutional:      Appearance: She is well-developed.  Pulmonary:     Effort: Pulmonary effort is normal.  Musculoskeletal:        General: Normal range of motion.  Skin:    General: Skin is warm.  Neurological:     Mental Status: She is alert.     Neurovascular status found to be intact muscle strength adequate range of motion within normal limits.  Patient is found to have on the right exquisite discomfort third MPJ with inflammation fluid in the joint and on the left there is a lesion plantar arch measuring 1.5 cm x 1 cm that is hard and within the plantar fascia itself and not currently tender     Assessment:  2 separate problems with 1 being capsulitis right and secondarily plantar fibroma left     Plan:  H&P conditions discussed and at this point for the right I did go ahead I did a proximal nerve block I aspirated the joint getting a small amount of clear fluid injected quarter cc dexamethasone Kenalog discussed long-term orthotics to be used pressure off the joint which patient wants due to her activity levels.  For the left I do not recommend treatment unless it grew in size or became painful  X-rays indicate there is some narrowing of the joint surface third MPJ no indication stress fracture arthritis left

## 2019-06-14 ENCOUNTER — Ambulatory Visit: Payer: Medicare HMO | Admitting: Podiatry

## 2019-06-16 ENCOUNTER — Telehealth: Payer: Self-pay | Admitting: General Practice

## 2019-06-16 NOTE — Telephone Encounter (Signed)
Per initial encounter, "Pt is schedule for her covid vaccine tomorrow and she was exposed to her grand daughter who tested positive yesterday. Please advise"; revewed COVID symptoms, and quarantine guidelines; also explained that it can take up to 14 days for symptoms to manifestwith pt; pt advised to monitor for symptoms and quarantine; pt advised to hold vaccination because the effects can mask symptoms of an actual COVID infection; pt also advised to contact her PCP to discuss appropriateness of vaccine and test; she verbalized understanding.

## 2019-06-16 NOTE — Telephone Encounter (Signed)
Pt is schedule for her covid vaccine tomorrow and she was exposed to her grand daughter who tested positive yesterday. Please advise

## 2019-06-17 ENCOUNTER — Ambulatory Visit: Payer: Medicare HMO

## 2019-06-21 DIAGNOSIS — Z86010 Personal history of colonic polyps: Secondary | ICD-10-CM | POA: Diagnosis not present

## 2019-06-21 DIAGNOSIS — K5730 Diverticulosis of large intestine without perforation or abscess without bleeding: Secondary | ICD-10-CM | POA: Diagnosis not present

## 2019-06-22 ENCOUNTER — Encounter: Payer: Self-pay | Admitting: Podiatry

## 2019-06-22 ENCOUNTER — Ambulatory Visit: Payer: Medicare HMO | Admitting: Podiatry

## 2019-06-22 ENCOUNTER — Other Ambulatory Visit: Payer: Self-pay

## 2019-06-22 VITALS — Temp 96.9°F

## 2019-06-22 DIAGNOSIS — M722 Plantar fascial fibromatosis: Secondary | ICD-10-CM

## 2019-06-22 DIAGNOSIS — M779 Enthesopathy, unspecified: Secondary | ICD-10-CM

## 2019-06-25 NOTE — Progress Notes (Signed)
Subjective:   Patient ID: [name], adult   DOB: 79 y.o.   MRN: [id number]   HPI Patient presents stating she is feeling somewhat better and states the pads seem to help her and she is trying to be smarter with shoe gear   ROS      Objective:  Physical Exam  Neurovascular status intact with patient having diminished discomfort in the forefoot bilateral with diminished edema and no erythema currently.  She does have prominent metatarsals and moderate cavus structure     Assessment:  Improved capsulitis secondary to previous aggressive treatment with patient utilizing Pap therapy shoe gear modification     Plan:  H&P reviewed capsulitis reviewed foot structure and discussed anti-inflammatory she can take over-the-counter.  I recommended continued pad usage and I dispensed today with instructions to get these and just to use them when she is very active and to be very careful with her shoe gear usage and at home also I want her to possibly consider orthotics which I reviewed with her today

## 2019-06-28 ENCOUNTER — Ambulatory Visit: Payer: Medicare HMO | Attending: Internal Medicine

## 2019-06-28 DIAGNOSIS — Z23 Encounter for immunization: Secondary | ICD-10-CM

## 2019-06-28 NOTE — Progress Notes (Signed)
   Covid-19 Vaccination Clinic  Name:  MILIANA DOZOIS    MRN: [id number] DOB: Apr 27, 1940  06/28/2019    Hollrah was observed post Covid-19 immunization for 15 minutes without incident. [name] was provided with Vaccine Information Sheet and instruction to access the V-Safe system.     Tiet was instructed to call 911 with any severe reactions post vaccine: Marland Kitchen Difficulty breathing  . Swelling of face and throat  . A fast heartbeat  . A bad rash all over body  . Dizziness and weakness   Immunizations Administered    Name Date Dose VIS Date Route   Pfizer COVID-19 Vaccine 06/28/2019  3:23 PM 0.3 mL 04/06/2019 Intramuscular   Manufacturer: [place]   Lot: [id number]   [place]: [id number]

## 2019-07-06 DIAGNOSIS — Z1159 Encounter for screening for other viral diseases: Secondary | ICD-10-CM | POA: Diagnosis not present

## 2019-07-11 DIAGNOSIS — K6389 Other specified diseases of intestine: Secondary | ICD-10-CM | POA: Diagnosis not present

## 2019-07-11 DIAGNOSIS — K5730 Diverticulosis of large intestine without perforation or abscess without bleeding: Secondary | ICD-10-CM | POA: Diagnosis not present

## 2019-07-11 DIAGNOSIS — D124 Benign neoplasm of descending colon: Secondary | ICD-10-CM | POA: Diagnosis not present

## 2019-07-11 DIAGNOSIS — Z86010 Personal history of colonic polyps: Secondary | ICD-10-CM | POA: Diagnosis not present

## 2019-07-13 DIAGNOSIS — D124 Benign neoplasm of descending colon: Secondary | ICD-10-CM | POA: Diagnosis not present

## 2019-07-25 ENCOUNTER — Ambulatory Visit: Payer: Medicare HMO | Attending: Internal Medicine

## 2019-07-25 DIAGNOSIS — Z23 Encounter for immunization: Secondary | ICD-10-CM

## 2019-07-25 NOTE — Progress Notes (Signed)
   Covid-19 Vaccination Clinic  Name:  [name]    MRN: [id number] DOB: [birth year]  07/25/2019    Matsushita was observed post Covid-19 immunization for 15 minutes without incident. [name] was provided with Vaccine Information Sheet and instruction to access the V-Safe system.     Fork was instructed to call 911 with any severe reactions post vaccine: Marland Kitchen Difficulty breathing  . Swelling of face and throat  . A fast heartbeat  . A bad rash all over body  . Dizziness and weakness   Immunizations Administered    Name Date Dose VIS Date Route   Pfizer COVID-19 Vaccine 07/25/2019  3:02 PM 0.3 mL 04/06/2019 Intramuscular   Manufacturer: [place]   Lot: [id number]   [place]: [id number]

## 2019-08-31 DIAGNOSIS — I10 Essential (primary) hypertension: Secondary | ICD-10-CM | POA: Diagnosis not present

## 2019-08-31 DIAGNOSIS — E782 Mixed hyperlipidemia: Secondary | ICD-10-CM | POA: Diagnosis not present

## 2019-08-31 DIAGNOSIS — M810 Age-related osteoporosis without current pathological fracture: Secondary | ICD-10-CM | POA: Diagnosis not present

## 2019-08-31 DIAGNOSIS — R7309 Other abnormal glucose: Secondary | ICD-10-CM | POA: Diagnosis not present

## 2019-08-31 DIAGNOSIS — Z0000 Encounter for general adult medical examination without abnormal findings: Secondary | ICD-10-CM | POA: Diagnosis not present

## 2019-08-31 DIAGNOSIS — H6121 Impacted cerumen, right ear: Secondary | ICD-10-CM | POA: Diagnosis not present

## 2020-02-01 ENCOUNTER — Other Ambulatory Visit: Payer: Self-pay | Admitting: Family Medicine

## 2020-02-01 DIAGNOSIS — Z0000 Encounter for general adult medical examination without abnormal findings: Secondary | ICD-10-CM

## 2020-02-14 DIAGNOSIS — Z23 Encounter for immunization: Secondary | ICD-10-CM | POA: Diagnosis not present

## 2020-02-28 ENCOUNTER — Ambulatory Visit: Payer: Medicare HMO

## 2020-03-04 DIAGNOSIS — G4700 Insomnia, unspecified: Secondary | ICD-10-CM | POA: Diagnosis not present

## 2020-03-04 DIAGNOSIS — E782 Mixed hyperlipidemia: Secondary | ICD-10-CM | POA: Diagnosis not present

## 2020-03-04 DIAGNOSIS — I10 Essential (primary) hypertension: Secondary | ICD-10-CM | POA: Diagnosis not present

## 2020-03-04 DIAGNOSIS — M810 Age-related osteoporosis without current pathological fracture: Secondary | ICD-10-CM | POA: Diagnosis not present

## 2020-04-07 ENCOUNTER — Other Ambulatory Visit: Payer: Self-pay

## 2020-04-07 ENCOUNTER — Ambulatory Visit
Admission: RE | Admit: 2020-04-07 | Discharge: 2020-04-07 | Disposition: A | Discharge status: Home / Self Care | Payer: Medicare HMO | Source: Ambulatory Visit | Attending: Family Medicine | Admitting: Family Medicine

## 2020-04-07 DIAGNOSIS — Z0000 Encounter for general adult medical examination without abnormal findings: Secondary | ICD-10-CM

## 2020-04-07 DIAGNOSIS — Z1231 Encounter for screening mammogram for malignant neoplasm of breast: Secondary | ICD-10-CM | POA: Diagnosis not present

## 2020-05-15 DIAGNOSIS — H524 Presbyopia: Secondary | ICD-10-CM | POA: Diagnosis not present

## 2020-05-15 DIAGNOSIS — Z0100 Encounter for examination of eyes and vision without abnormal findings: Secondary | ICD-10-CM | POA: Diagnosis not present

## 2020-06-28 DIAGNOSIS — R350 Frequency of micturition: Secondary | ICD-10-CM | POA: Diagnosis not present

## 2020-06-28 DIAGNOSIS — R3989 Other symptoms and signs involving the genitourinary system: Secondary | ICD-10-CM | POA: Diagnosis not present

## 2020-09-09 DIAGNOSIS — Z0000 Encounter for general adult medical examination without abnormal findings: Secondary | ICD-10-CM | POA: Diagnosis not present

## 2020-09-09 DIAGNOSIS — M810 Age-related osteoporosis without current pathological fracture: Secondary | ICD-10-CM | POA: Diagnosis not present

## 2020-09-09 DIAGNOSIS — E782 Mixed hyperlipidemia: Secondary | ICD-10-CM | POA: Diagnosis not present

## 2020-09-09 DIAGNOSIS — R7309 Other abnormal glucose: Secondary | ICD-10-CM | POA: Diagnosis not present

## 2020-09-09 DIAGNOSIS — G4700 Insomnia, unspecified: Secondary | ICD-10-CM | POA: Diagnosis not present

## 2020-09-09 DIAGNOSIS — I10 Essential (primary) hypertension: Secondary | ICD-10-CM | POA: Diagnosis not present

## 2020-09-16 ENCOUNTER — Other Ambulatory Visit: Payer: Self-pay | Admitting: Family Medicine

## 2020-09-16 DIAGNOSIS — M810 Age-related osteoporosis without current pathological fracture: Secondary | ICD-10-CM

## 2020-10-16 DIAGNOSIS — R300 Dysuria: Secondary | ICD-10-CM | POA: Diagnosis not present

## 2020-10-16 DIAGNOSIS — N390 Urinary tract infection, site not specified: Secondary | ICD-10-CM | POA: Diagnosis not present

## 2020-12-02 DIAGNOSIS — M170 Bilateral primary osteoarthritis of knee: Secondary | ICD-10-CM | POA: Diagnosis not present

## 2020-12-02 DIAGNOSIS — M19049 Primary osteoarthritis, unspecified hand: Secondary | ICD-10-CM | POA: Diagnosis not present

## 2020-12-25 DIAGNOSIS — L821 Other seborrheic keratosis: Secondary | ICD-10-CM | POA: Diagnosis not present

## 2020-12-25 DIAGNOSIS — D225 Melanocytic nevi of trunk: Secondary | ICD-10-CM | POA: Diagnosis not present

## 2020-12-25 DIAGNOSIS — L820 Inflamed seborrheic keratosis: Secondary | ICD-10-CM | POA: Diagnosis not present

## 2021-01-02 DIAGNOSIS — R350 Frequency of micturition: Secondary | ICD-10-CM | POA: Diagnosis not present

## 2021-01-05 DIAGNOSIS — Z86010 Personal history of colonic polyps: Secondary | ICD-10-CM | POA: Diagnosis not present

## 2021-01-05 DIAGNOSIS — R194 Change in bowel habit: Secondary | ICD-10-CM | POA: Diagnosis not present

## 2021-01-05 DIAGNOSIS — K921 Melena: Secondary | ICD-10-CM | POA: Diagnosis not present

## 2021-01-15 DIAGNOSIS — Z23 Encounter for immunization: Secondary | ICD-10-CM | POA: Diagnosis not present

## 2021-01-29 DIAGNOSIS — R350 Frequency of micturition: Secondary | ICD-10-CM | POA: Diagnosis not present

## 2021-01-30 DIAGNOSIS — R350 Frequency of micturition: Secondary | ICD-10-CM | POA: Diagnosis not present

## 2021-03-06 ENCOUNTER — Other Ambulatory Visit: Payer: Self-pay | Admitting: Family Medicine

## 2021-03-06 DIAGNOSIS — Z1231 Encounter for screening mammogram for malignant neoplasm of breast: Secondary | ICD-10-CM

## 2021-03-11 DIAGNOSIS — N390 Urinary tract infection, site not specified: Secondary | ICD-10-CM | POA: Diagnosis not present

## 2021-03-12 DIAGNOSIS — R350 Frequency of micturition: Secondary | ICD-10-CM | POA: Diagnosis not present

## 2021-03-12 DIAGNOSIS — M810 Age-related osteoporosis without current pathological fracture: Secondary | ICD-10-CM | POA: Diagnosis not present

## 2021-03-12 DIAGNOSIS — I10 Essential (primary) hypertension: Secondary | ICD-10-CM | POA: Diagnosis not present

## 2021-03-12 DIAGNOSIS — E782 Mixed hyperlipidemia: Secondary | ICD-10-CM | POA: Diagnosis not present

## 2021-03-12 IMAGING — MG DIGITAL SCREENING BILAT W/ TOMO
8 series · 9 of 24 positions shown · non-contrast
Comparison: Previous exam(s).

CLINICAL DATA: Screening.

EXAM:
DIGITAL SCREENING BILATERAL MAMMOGRAM WITH TOMO AND CAD

[R CC synth-2D]
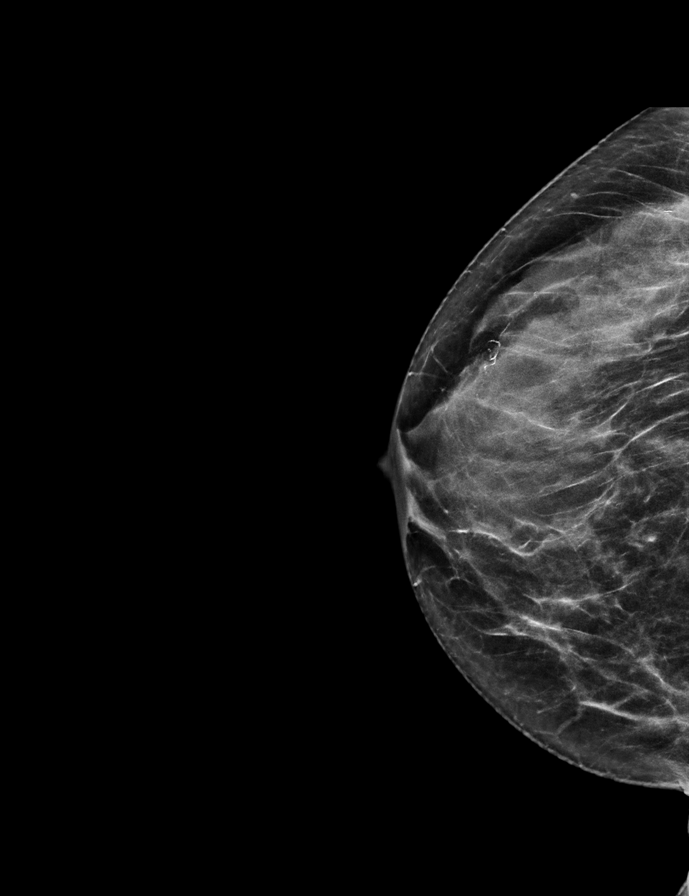

[R MLO synth-2D]
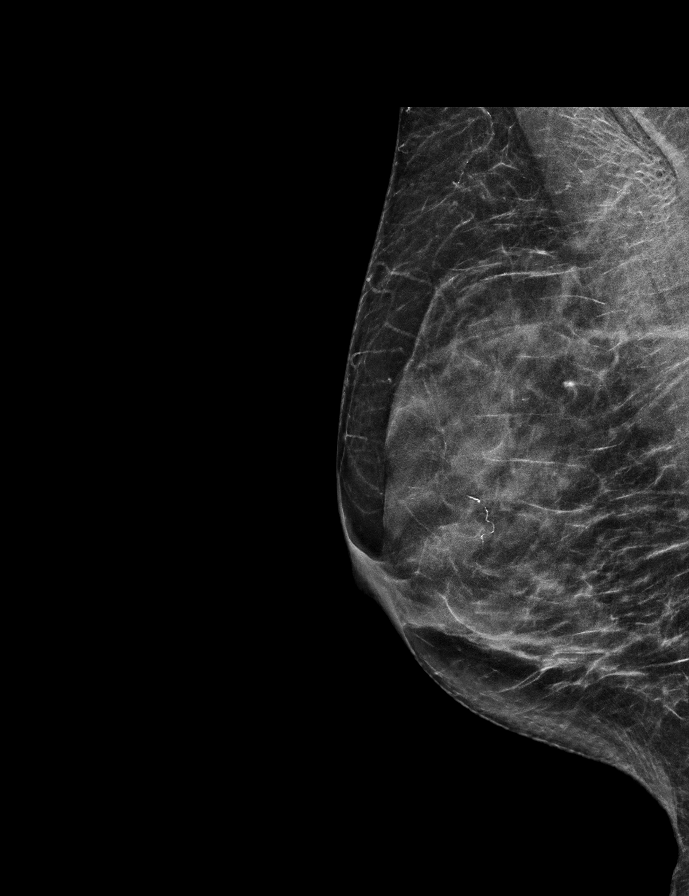

[L CC synth-2D]
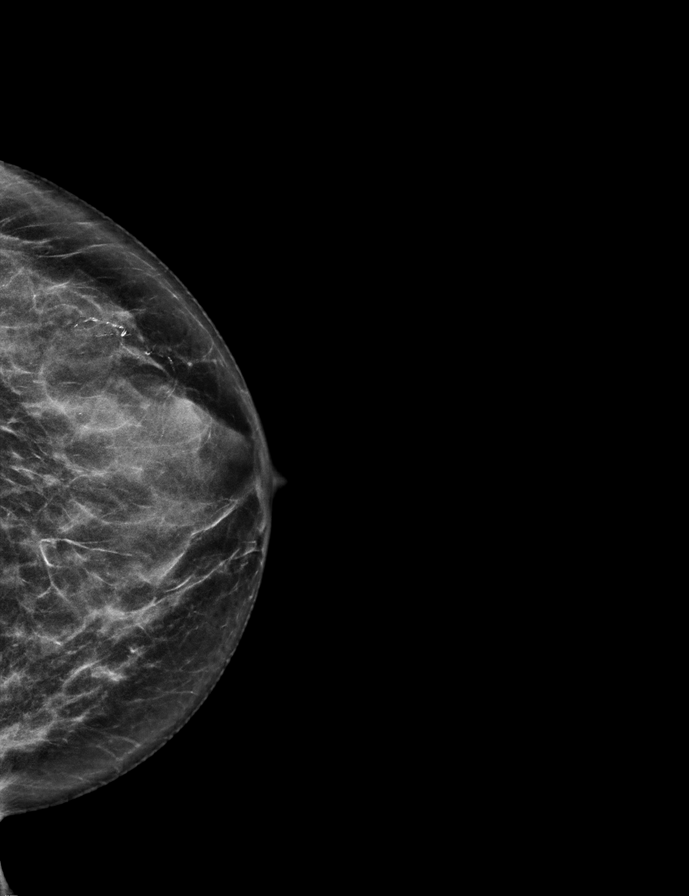

[L MLO synth-2D]
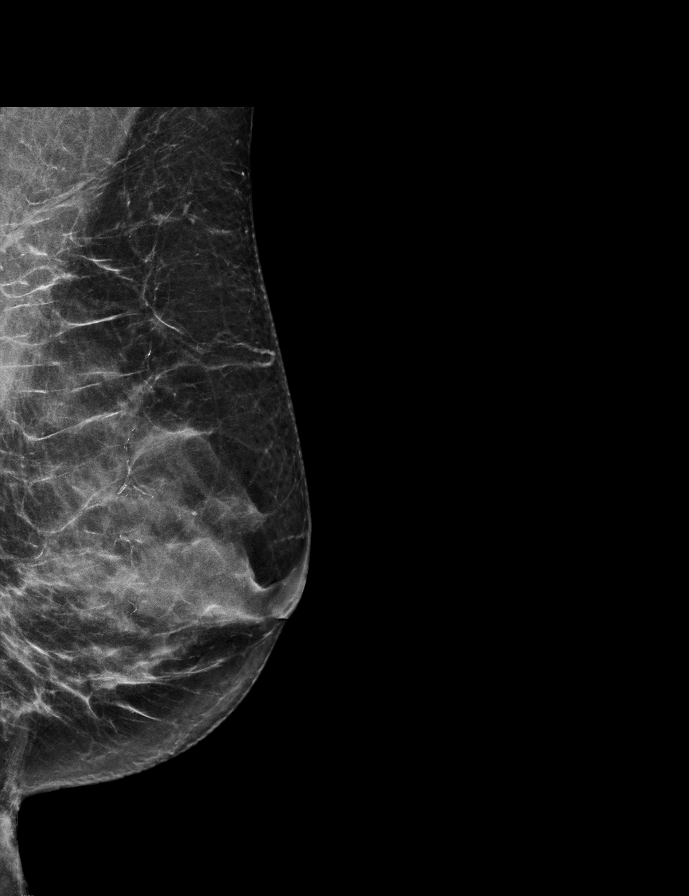

[L CC tomo · 2 of 56 frames shown]
[frame 19/56]
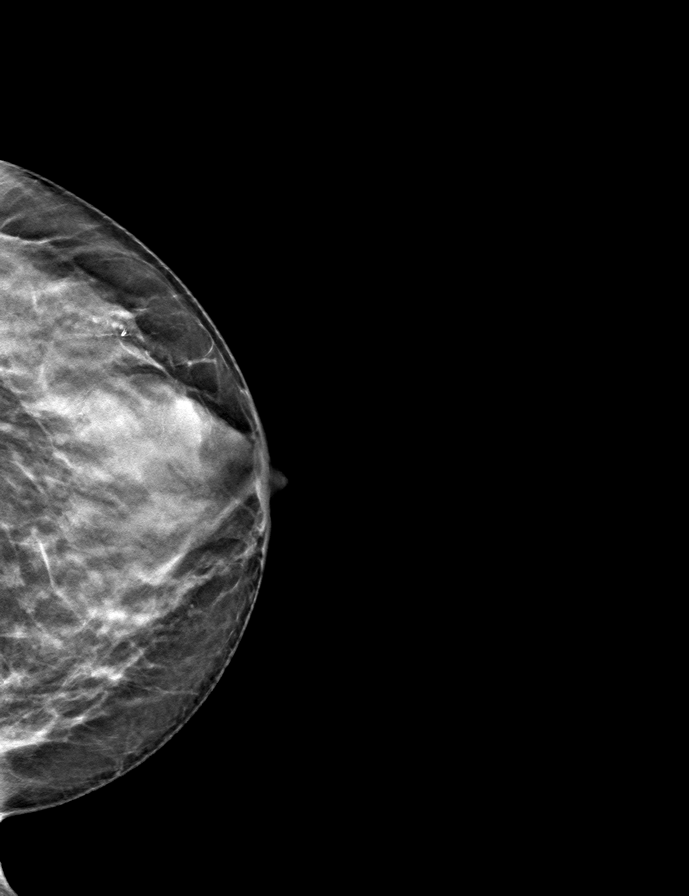
[frame 29/56]
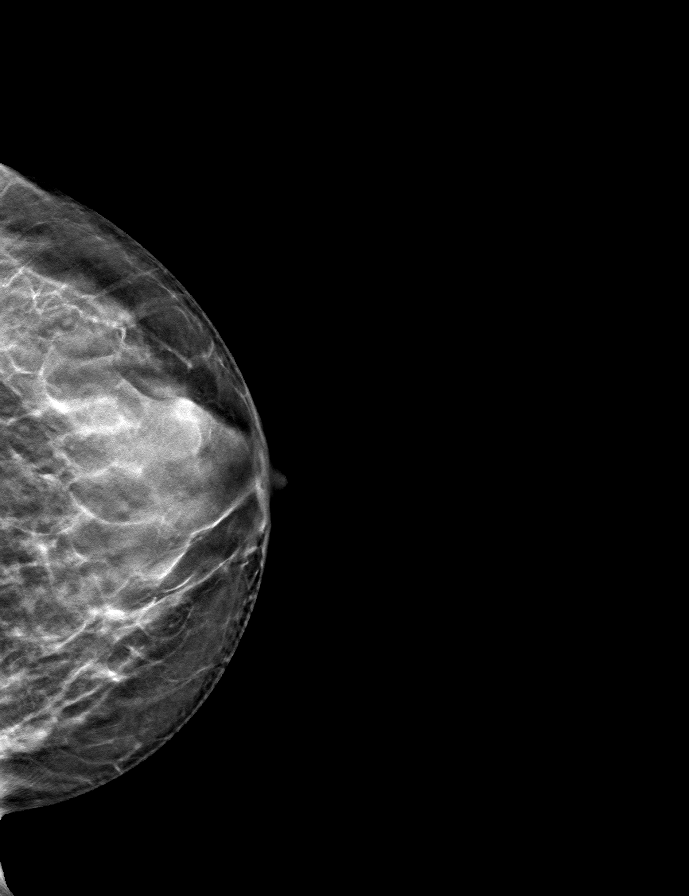

[R CC tomo · tomo slice 29/57.0]
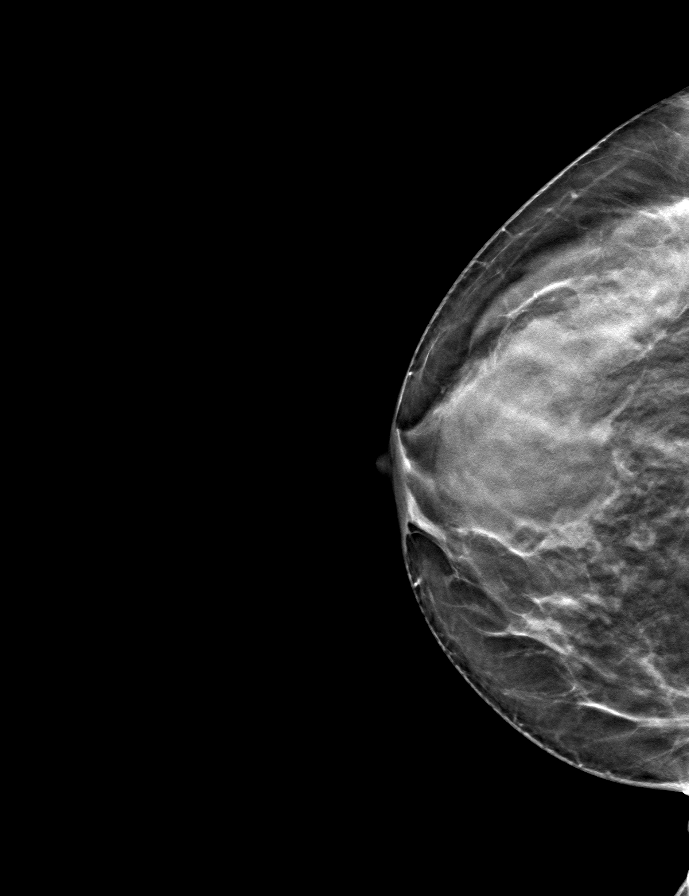

[R MLO tomo · tomo slice 29/58.0]
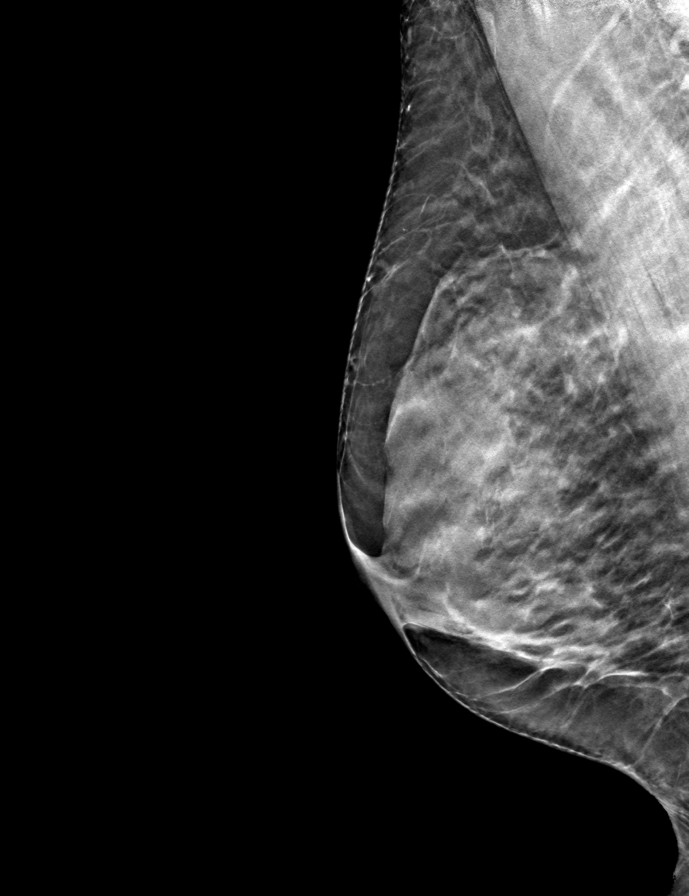

[L MLO tomo · tomo slice 31/61.0]
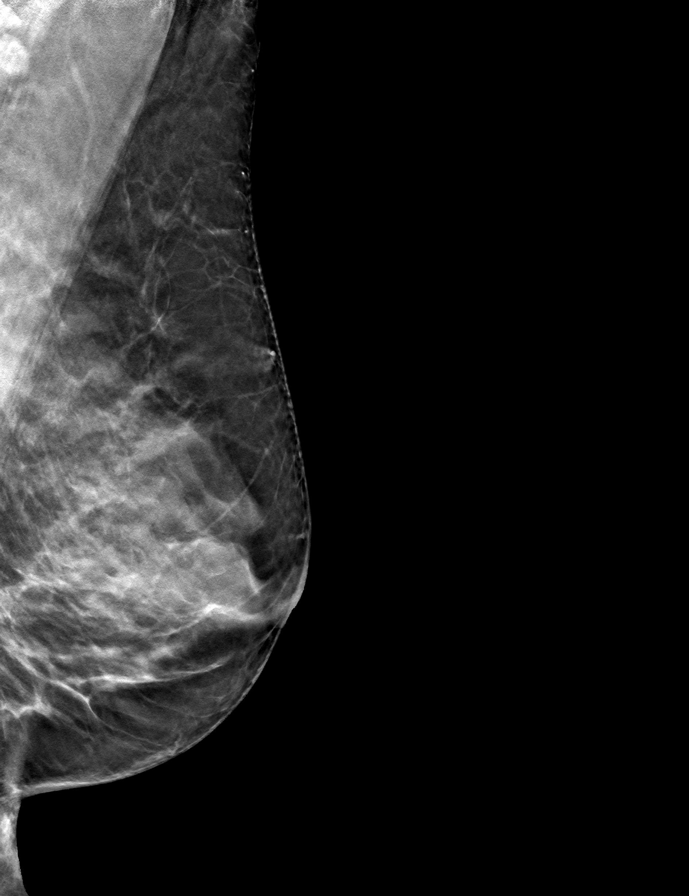

[9 of 24 positions shown; findings below may reference images not displayed]

ACR Breast Density Category d: The breast tissue is extremely dense,
which lowers the sensitivity of mammography
FINDINGS: There are no findings suspicious for malignancy. Images were
processed with CAD.
IMPRESSION: No mammographic evidence of malignancy. A result letter of this
screening mammogram will be mailed directly to the patient.

RECOMMENDATION:
Screening mammogram in one year. (Code:WO-0-ZI0)

BI-RADS CATEGORY  1: Negative.

## 2021-04-09 ENCOUNTER — Ambulatory Visit
Admission: RE | Admit: 2021-04-09 | Discharge: 2021-04-09 | Disposition: A | Discharge status: Home / Self Care | Payer: Medicare HMO | Source: Ambulatory Visit | Attending: Family Medicine | Admitting: Family Medicine

## 2021-04-09 ENCOUNTER — Other Ambulatory Visit: Payer: Self-pay

## 2021-04-09 DIAGNOSIS — Z1231 Encounter for screening mammogram for malignant neoplasm of breast: Secondary | ICD-10-CM

## 2021-05-07 DIAGNOSIS — N390 Urinary tract infection, site not specified: Secondary | ICD-10-CM | POA: Diagnosis not present

## 2021-05-07 DIAGNOSIS — R350 Frequency of micturition: Secondary | ICD-10-CM | POA: Diagnosis not present

## 2021-08-28 DIAGNOSIS — N390 Urinary tract infection, site not specified: Secondary | ICD-10-CM | POA: Diagnosis not present

## 2021-08-28 DIAGNOSIS — R350 Frequency of micturition: Secondary | ICD-10-CM | POA: Diagnosis not present

## 2021-09-10 DIAGNOSIS — M810 Age-related osteoporosis without current pathological fracture: Secondary | ICD-10-CM | POA: Diagnosis not present

## 2021-09-10 DIAGNOSIS — R7309 Other abnormal glucose: Secondary | ICD-10-CM | POA: Diagnosis not present

## 2021-09-10 DIAGNOSIS — Z0000 Encounter for general adult medical examination without abnormal findings: Secondary | ICD-10-CM | POA: Diagnosis not present

## 2021-09-10 DIAGNOSIS — E782 Mixed hyperlipidemia: Secondary | ICD-10-CM | POA: Diagnosis not present

## 2021-09-14 DIAGNOSIS — G4700 Insomnia, unspecified: Secondary | ICD-10-CM | POA: Diagnosis not present

## 2021-09-14 DIAGNOSIS — M810 Age-related osteoporosis without current pathological fracture: Secondary | ICD-10-CM | POA: Diagnosis not present

## 2021-09-14 DIAGNOSIS — E782 Mixed hyperlipidemia: Secondary | ICD-10-CM | POA: Diagnosis not present

## 2021-09-14 DIAGNOSIS — R350 Frequency of micturition: Secondary | ICD-10-CM | POA: Diagnosis not present

## 2021-09-14 DIAGNOSIS — Z0000 Encounter for general adult medical examination without abnormal findings: Secondary | ICD-10-CM | POA: Diagnosis not present

## 2021-09-14 DIAGNOSIS — I10 Essential (primary) hypertension: Secondary | ICD-10-CM | POA: Diagnosis not present

## 2021-09-22 ENCOUNTER — Other Ambulatory Visit: Payer: Self-pay | Admitting: Family Medicine

## 2021-09-22 DIAGNOSIS — M810 Age-related osteoporosis without current pathological fracture: Secondary | ICD-10-CM

## 2021-12-17 DIAGNOSIS — D485 Neoplasm of uncertain behavior of skin: Secondary | ICD-10-CM | POA: Diagnosis not present

## 2021-12-17 DIAGNOSIS — Z1283 Encounter for screening for malignant neoplasm of skin: Secondary | ICD-10-CM | POA: Diagnosis not present

## 2021-12-17 DIAGNOSIS — D225 Melanocytic nevi of trunk: Secondary | ICD-10-CM | POA: Diagnosis not present

## 2021-12-17 DIAGNOSIS — D2271 Melanocytic nevi of right lower limb, including hip: Secondary | ICD-10-CM | POA: Diagnosis not present

## 2022-01-12 DIAGNOSIS — Z23 Encounter for immunization: Secondary | ICD-10-CM | POA: Diagnosis not present

## 2022-01-18 DIAGNOSIS — N390 Urinary tract infection, site not specified: Secondary | ICD-10-CM | POA: Diagnosis not present

## 2022-01-18 DIAGNOSIS — R311 Benign essential microscopic hematuria: Secondary | ICD-10-CM | POA: Diagnosis not present

## 2022-02-26 DIAGNOSIS — H5201 Hypermetropia, right eye: Secondary | ICD-10-CM | POA: Diagnosis not present

## 2022-03-08 ENCOUNTER — Other Ambulatory Visit: Payer: Self-pay | Admitting: Family Medicine

## 2022-03-08 DIAGNOSIS — Z1231 Encounter for screening mammogram for malignant neoplasm of breast: Secondary | ICD-10-CM

## 2022-03-29 DIAGNOSIS — E782 Mixed hyperlipidemia: Secondary | ICD-10-CM | POA: Diagnosis not present

## 2022-03-29 DIAGNOSIS — M25569 Pain in unspecified knee: Secondary | ICD-10-CM | POA: Diagnosis not present

## 2022-03-29 DIAGNOSIS — I10 Essential (primary) hypertension: Secondary | ICD-10-CM | POA: Diagnosis not present

## 2022-03-29 DIAGNOSIS — R7309 Other abnormal glucose: Secondary | ICD-10-CM | POA: Diagnosis not present

## 2022-04-13 ENCOUNTER — Ambulatory Visit: Payer: Medicare HMO

## 2022-04-16 DIAGNOSIS — R350 Frequency of micturition: Secondary | ICD-10-CM | POA: Diagnosis not present

## 2022-05-18 ENCOUNTER — Ambulatory Visit
Admission: RE | Admit: 2022-05-18 | Discharge: 2022-05-18 | Disposition: A | Discharge status: Home / Self Care | Payer: Medicare HMO | Source: Ambulatory Visit | Attending: Family Medicine | Admitting: Family Medicine

## 2022-05-18 DIAGNOSIS — M85832 Other specified disorders of bone density and structure, left forearm: Secondary | ICD-10-CM | POA: Diagnosis not present

## 2022-05-18 DIAGNOSIS — Z780 Asymptomatic menopausal state: Secondary | ICD-10-CM | POA: Diagnosis not present

## 2022-05-18 DIAGNOSIS — M810 Age-related osteoporosis without current pathological fracture: Secondary | ICD-10-CM

## 2022-06-03 ENCOUNTER — Ambulatory Visit
Admission: RE | Admit: 2022-06-03 | Discharge: 2022-06-03 | Disposition: A | Discharge status: Home / Self Care | Payer: Medicare HMO | Source: Ambulatory Visit | Attending: Family Medicine | Admitting: Family Medicine

## 2022-06-03 DIAGNOSIS — Z1231 Encounter for screening mammogram for malignant neoplasm of breast: Secondary | ICD-10-CM

## 2022-06-08 DIAGNOSIS — M1712 Unilateral primary osteoarthritis, left knee: Secondary | ICD-10-CM | POA: Diagnosis not present

## 2022-06-08 DIAGNOSIS — M1611 Unilateral primary osteoarthritis, right hip: Secondary | ICD-10-CM | POA: Diagnosis not present

## 2022-06-08 DIAGNOSIS — M1612 Unilateral primary osteoarthritis, left hip: Secondary | ICD-10-CM | POA: Diagnosis not present

## 2022-06-16 DIAGNOSIS — M6281 Muscle weakness (generalized): Secondary | ICD-10-CM | POA: Diagnosis not present

## 2022-06-16 DIAGNOSIS — M7062 Trochanteric bursitis, left hip: Secondary | ICD-10-CM | POA: Diagnosis not present

## 2022-06-28 DIAGNOSIS — M7062 Trochanteric bursitis, left hip: Secondary | ICD-10-CM | POA: Diagnosis not present

## 2022-06-28 DIAGNOSIS — M6281 Muscle weakness (generalized): Secondary | ICD-10-CM | POA: Diagnosis not present

## 2022-07-05 DIAGNOSIS — M7062 Trochanteric bursitis, left hip: Secondary | ICD-10-CM | POA: Diagnosis not present

## 2022-07-05 DIAGNOSIS — M6281 Muscle weakness (generalized): Secondary | ICD-10-CM | POA: Diagnosis not present

## 2022-07-08 DIAGNOSIS — M6281 Muscle weakness (generalized): Secondary | ICD-10-CM | POA: Diagnosis not present

## 2022-07-08 DIAGNOSIS — M7062 Trochanteric bursitis, left hip: Secondary | ICD-10-CM | POA: Diagnosis not present

## 2022-07-12 DIAGNOSIS — M7062 Trochanteric bursitis, left hip: Secondary | ICD-10-CM | POA: Diagnosis not present

## 2022-07-12 DIAGNOSIS — M6281 Muscle weakness (generalized): Secondary | ICD-10-CM | POA: Diagnosis not present

## 2022-07-15 DIAGNOSIS — M7062 Trochanteric bursitis, left hip: Secondary | ICD-10-CM | POA: Diagnosis not present

## 2022-07-15 DIAGNOSIS — M6281 Muscle weakness (generalized): Secondary | ICD-10-CM | POA: Diagnosis not present

## 2022-07-19 DIAGNOSIS — M6281 Muscle weakness (generalized): Secondary | ICD-10-CM | POA: Diagnosis not present

## 2022-07-19 DIAGNOSIS — M7062 Trochanteric bursitis, left hip: Secondary | ICD-10-CM | POA: Diagnosis not present

## 2022-09-02 DIAGNOSIS — N390 Urinary tract infection, site not specified: Secondary | ICD-10-CM | POA: Diagnosis not present

## 2022-09-02 DIAGNOSIS — R350 Frequency of micturition: Secondary | ICD-10-CM | POA: Diagnosis not present

## 2022-09-16 ENCOUNTER — Ambulatory Visit: Payer: Medicare HMO | Admitting: Podiatry

## 2022-09-16 ENCOUNTER — Encounter: Payer: Self-pay | Admitting: Podiatry

## 2022-09-16 ENCOUNTER — Ambulatory Visit (INDEPENDENT_AMBULATORY_CARE_PROVIDER_SITE_OTHER): Payer: Medicare HMO

## 2022-09-16 DIAGNOSIS — M7752 Other enthesopathy of left foot: Secondary | ICD-10-CM | POA: Diagnosis not present

## 2022-09-16 DIAGNOSIS — M7751 Other enthesopathy of right foot: Secondary | ICD-10-CM

## 2022-09-16 DIAGNOSIS — M79671 Pain in right foot: Secondary | ICD-10-CM

## 2022-09-16 DIAGNOSIS — M79672 Pain in left foot: Secondary | ICD-10-CM | POA: Diagnosis not present

## 2022-09-16 MED ORDER — TRIAMCINOLONE ACETONIDE 10 MG/ML IJ SUSP
20.0000 mg | Freq: Once | INTRAMUSCULAR | Status: AC
Start: 2022-09-16 — End: 2022-09-16
  Administered 2022-09-16: 20 mg

## 2022-09-17 DIAGNOSIS — R350 Frequency of micturition: Secondary | ICD-10-CM | POA: Diagnosis not present

## 2022-09-17 DIAGNOSIS — Z86010 Personal history of colonic polyps: Secondary | ICD-10-CM | POA: Diagnosis not present

## 2022-09-17 NOTE — Progress Notes (Signed)
Subjective:   Patient ID: [name], adult   DOB: 82 y.o.   MRN: [id number]   HPI Patient states she has been getting pain in both feet and it has been going on what we did 3 years ago was very effective to help her but it is gradually begun over the last 2 years in the last few months a bit worse with inflammation pain to the forefoot bilateral.  Patient does not smoke likes to be active   Review of Systems  All other systems reviewed and are negative.       Objective:  Physical Exam Vitals and nursing note reviewed.  Constitutional:      Appearance: Linda HOLLINGER "Fannie Knee" is well-developed.  Pulmonary:     Effort: Pulmonary effort is normal.  Musculoskeletal:        General: Normal range of motion.  Skin:    General: Skin is warm.  Neurological:     Mental Status: Linda HOHNSTEIN "Fannie Knee" is alert.     Neurovascular status found to be intact muscle strength adequate range of motion adequate subtalar midtarsal joint.  Elevation of the second digit bilateral with medial dislocation inflammation around the second and third MPJs of both feet with fluid buildup around the joint surfaces.  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  I did periarticular injections around the second and third MPJs bilateral to reduce inflammation I advised on rigid bottom shoes and still may require orthotics but organ to try to hold off.  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

## 2022-09-24 ENCOUNTER — Other Ambulatory Visit: Payer: Self-pay | Admitting: Podiatry

## 2022-09-24 DIAGNOSIS — M79671 Pain in right foot: Secondary | ICD-10-CM

## 2022-09-24 DIAGNOSIS — M7752 Other enthesopathy of left foot: Secondary | ICD-10-CM

## 2022-09-24 DIAGNOSIS — M7751 Other enthesopathy of right foot: Secondary | ICD-10-CM

## 2022-10-06 DIAGNOSIS — R7309 Other abnormal glucose: Secondary | ICD-10-CM | POA: Diagnosis not present

## 2022-10-06 DIAGNOSIS — I10 Essential (primary) hypertension: Secondary | ICD-10-CM | POA: Diagnosis not present

## 2022-10-06 DIAGNOSIS — E782 Mixed hyperlipidemia: Secondary | ICD-10-CM | POA: Diagnosis not present

## 2022-10-06 DIAGNOSIS — Z0000 Encounter for general adult medical examination without abnormal findings: Secondary | ICD-10-CM | POA: Diagnosis not present

## 2022-10-06 DIAGNOSIS — M810 Age-related osteoporosis without current pathological fracture: Secondary | ICD-10-CM | POA: Diagnosis not present

## 2022-11-25 DIAGNOSIS — K5730 Diverticulosis of large intestine without perforation or abscess without bleeding: Secondary | ICD-10-CM | POA: Diagnosis not present

## 2022-11-25 DIAGNOSIS — Z86010 Personal history of colonic polyps: Secondary | ICD-10-CM | POA: Diagnosis not present

## 2022-11-25 DIAGNOSIS — D123 Benign neoplasm of transverse colon: Secondary | ICD-10-CM | POA: Diagnosis not present

## 2022-11-25 DIAGNOSIS — Z09 Encounter for follow-up examination after completed treatment for conditions other than malignant neoplasm: Secondary | ICD-10-CM | POA: Diagnosis not present

## 2022-11-25 DIAGNOSIS — K5520 Angiodysplasia of colon without hemorrhage: Secondary | ICD-10-CM | POA: Diagnosis not present

## 2022-11-30 DIAGNOSIS — R350 Frequency of micturition: Secondary | ICD-10-CM | POA: Diagnosis not present

## 2022-12-02 DIAGNOSIS — D123 Benign neoplasm of transverse colon: Secondary | ICD-10-CM | POA: Diagnosis not present

## 2022-12-16 ENCOUNTER — Other Ambulatory Visit: Payer: Self-pay | Admitting: Gastroenterology

## 2022-12-16 DIAGNOSIS — D126 Benign neoplasm of colon, unspecified: Secondary | ICD-10-CM | POA: Diagnosis not present

## 2023-01-01 DIAGNOSIS — R399 Unspecified symptoms and signs involving the genitourinary system: Secondary | ICD-10-CM | POA: Diagnosis not present

## 2023-01-01 DIAGNOSIS — N390 Urinary tract infection, site not specified: Secondary | ICD-10-CM | POA: Diagnosis not present

## 2023-01-11 ENCOUNTER — Other Ambulatory Visit: Payer: Self-pay

## 2023-01-11 NOTE — Progress Notes (Signed)
Anesthesia Review:  PCP: Cardiologist : Chest x-ray : EKG : Echo : Stress test: Cardiac Cath :  Activity level:  Sleep Study/ CPAP : Fasting Blood Sugar :      / Checks Blood Sugar -- times a day:   Blood Thinner/ Instructions /Last Dose: ASA / Instructions/ Last Dose :

## 2023-01-17 NOTE — Anesthesia Preprocedure Evaluation (Signed)
Anesthesia Evaluation  Patient identified by MRN, date of birth, ID band Patient awake    Reviewed: Allergy & Precautions, NPO status , Patient's Chart, lab work & pertinent test results  Airway Mallampati: II  TM Distance: >3 FB Neck ROM: Full    Dental no notable dental hx. (+) Teeth Intact, Dental Advisory Given, Implants   Pulmonary neg pulmonary ROS   Pulmonary exam normal breath sounds clear to auscultation       Cardiovascular hypertension, Normal cardiovascular exam Rhythm:Regular Rate:Normal     Neuro/Psych negative neurological ROS     GI/Hepatic ,GERD  ,,Colonic adenoma   Endo/Other    Renal/GU      Musculoskeletal  (+) Arthritis ,    Abdominal   Peds  Hematology   Anesthesia Other Findings All: codeine  Reproductive/Obstetrics                             Anesthesia Physical Anesthesia Plan  ASA: 3  Anesthesia Plan: MAC   Post-op Pain Management:    Induction:   PONV Risk Score and Plan: Propofol infusion and Treatment may vary due to age or medical condition  Airway Management Planned: Nasal Cannula and Natural Airway  Additional Equipment: None  Intra-op Plan:   Post-operative Plan:   Informed Consent: I have reviewed the patients History and Physical, chart, labs and discussed the procedure including the risks, benefits and alternatives for the proposed anesthesia with the patient or authorized representative who has indicated his/her understanding and acceptance.     Dental advisory given  Plan Discussed with: CRNA  Anesthesia Plan Comments:         Anesthesia Quick Evaluation

## 2023-01-18 ENCOUNTER — Other Ambulatory Visit: Payer: Self-pay

## 2023-01-18 ENCOUNTER — Ambulatory Visit (HOSPITAL_COMMUNITY)
Admission: RE | Admit: 2023-01-18 | Discharge: 2023-01-18 | Disposition: A | Discharge status: Home / Self Care | Payer: Medicare HMO | Attending: Gastroenterology | Admitting: Gastroenterology

## 2023-01-18 ENCOUNTER — Ambulatory Visit (HOSPITAL_BASED_OUTPATIENT_CLINIC_OR_DEPARTMENT_OTHER): Payer: Medicare HMO

## 2023-01-18 ENCOUNTER — Encounter (HOSPITAL_COMMUNITY)
Admission: RE | Disposition: A | Discharge status: Home / Self Care | Payer: Self-pay | Source: Home / Self Care | Attending: Gastroenterology

## 2023-01-18 ENCOUNTER — Encounter (HOSPITAL_COMMUNITY): Payer: Self-pay | Admitting: Gastroenterology

## 2023-01-18 ENCOUNTER — Ambulatory Visit (HOSPITAL_COMMUNITY): Payer: Self-pay

## 2023-01-18 DIAGNOSIS — I10 Essential (primary) hypertension: Secondary | ICD-10-CM | POA: Insufficient documentation

## 2023-01-18 DIAGNOSIS — K219 Gastro-esophageal reflux disease without esophagitis: Secondary | ICD-10-CM | POA: Diagnosis not present

## 2023-01-18 DIAGNOSIS — K648 Other hemorrhoids: Secondary | ICD-10-CM | POA: Insufficient documentation

## 2023-01-18 DIAGNOSIS — Z09 Encounter for follow-up examination after completed treatment for conditions other than malignant neoplasm: Secondary | ICD-10-CM | POA: Insufficient documentation

## 2023-01-18 DIAGNOSIS — D123 Benign neoplasm of transverse colon: Secondary | ICD-10-CM

## 2023-01-18 DIAGNOSIS — K644 Residual hemorrhoidal skin tags: Secondary | ICD-10-CM | POA: Insufficient documentation

## 2023-01-18 DIAGNOSIS — K5730 Diverticulosis of large intestine without perforation or abscess without bleeding: Secondary | ICD-10-CM | POA: Diagnosis not present

## 2023-01-18 DIAGNOSIS — Z9889 Other specified postprocedural states: Secondary | ICD-10-CM | POA: Diagnosis not present

## 2023-01-18 DIAGNOSIS — K635 Polyp of colon: Secondary | ICD-10-CM | POA: Diagnosis not present

## 2023-01-18 DIAGNOSIS — M1990 Unspecified osteoarthritis, unspecified site: Secondary | ICD-10-CM | POA: Insufficient documentation

## 2023-01-18 DIAGNOSIS — K6389 Other specified diseases of intestine: Secondary | ICD-10-CM | POA: Diagnosis not present

## 2023-01-18 DIAGNOSIS — Z86010 Personal history of colonic polyps: Secondary | ICD-10-CM | POA: Insufficient documentation

## 2023-01-18 HISTORY — PX: POLYPECTOMY: SHX5525

## 2023-01-18 HISTORY — PX: FLEXIBLE SIGMOIDOSCOPY: SHX5431

## 2023-01-18 HISTORY — PX: BIOPSY: SHX5522

## 2023-01-18 SURGERY — SIGMOIDOSCOPY, FLEXIBLE
Anesthesia: Monitor Anesthesia Care | Laterality: Bilateral

## 2023-01-18 MED ORDER — SODIUM CHLORIDE 0.9 % IV SOLN: INTRAVENOUS | Status: DC

## 2023-01-18 MED ORDER — LIDOCAINE 2% (20 MG/ML) 5 ML SYRINGE
INTRAMUSCULAR | Status: DC | PRN
  Administered 2023-01-18: 40 mg via INTRAVENOUS

## 2023-01-18 MED ORDER — PROPOFOL 500 MG/50ML IV EMUL
INTRAVENOUS | Status: DC | PRN
  Administered 2023-01-18: 85 ug/kg/min via INTRAVENOUS
  Administered 2023-01-18: 100 ug/kg/min via INTRAVENOUS
  Administered 2023-01-18: 50 mg via INTRAVENOUS

## 2023-01-18 MED ORDER — PROPOFOL 10 MG/ML IV BOLUS
INTRAVENOUS | Status: AC
  Filled 2023-01-18: qty 20

## 2023-01-18 MED ORDER — LACTATED RINGERS IV SOLN
INTRAVENOUS | Status: AC | PRN
Start: 2023-01-18 — End: 2023-01-18
  Administered 2023-01-18: 1000 mL via INTRAVENOUS

## 2023-01-18 NOTE — Discharge Instructions (Signed)
YOU HAD AN ENDOSCOPIC PROCEDURE TODAY: Refer to the procedure report and other information in the discharge instructions given to you for any specific questions about what was found during the examination. If this information does not answer your questions, please call [facility] office at [phone] to clarify.   YOU SHOULD EXPECT: Some feelings of bloating in the abdomen. Passage of more gas than usual. Walking can help get rid of the air that was put into your GI tract during the procedure and reduce the bloating. If you had a lower endoscopy (such as a colonoscopy or flexible sigmoidoscopy) you may notice spotting of blood in your stool or on the toilet paper. Some abdominal soreness may be present for a day or two, also.  DIET: Your first meal following the procedure should be a light meal and then it is ok to progress to your normal diet. A half-sandwich or bowl of soup is an example of a good first meal. Heavy or fried foods are harder to digest and may make you feel nauseous or bloated. Drink plenty of fluids but you should avoid alcoholic beverages for 24 hours. If you had a esophageal dilation, please see attached instructions for diet.   ACTIVITY: Your care partner should take you home directly after the procedure. You should plan to take it easy, moving slowly for the rest of the day. You can resume normal activity the day after the procedure however YOU SHOULD NOT DRIVE, use power tools, machinery or perform tasks that involve climbing or major physical exertion for 24 hours (because of the sedation medicines used during the test).   SYMPTOMS TO REPORT IMMEDIATELY: A gastroenterologist can be reached at any hour. Please call [phone]  for any of the following symptoms:  . Following lower endoscopy (colonoscopy, flexible sigmoidoscopy) Excessive amounts of blood in the stool  Significant tenderness, worsening of abdominal pains  Swelling of the abdomen that is new, acute  Fever of 100  or higher   FOLLOW UP:  If any biopsies were taken you will be contacted by phone or by letter within the next 1-3 weeks. Call [phone]  if you have not heard about the biopsies in 3 weeks.  Please also call with any specific questions about appointments or follow up tests.

## 2023-01-18 NOTE — Anesthesia Postprocedure Evaluation (Signed)
Anesthesia Post Note  Patient: [name]  Procedure(s) Performed: FLEXIBLE SIGMOIDOSCOPY (Bilateral) POLYPECTOMY BIOPSY     Patient location during evaluation: Endoscopy Anesthesia Type: MAC Level of consciousness: awake and alert Pain management: pain level controlled Vital Signs Assessment: post-procedure vital signs reviewed and stable Respiratory status: spontaneous breathing, nonlabored ventilation, respiratory function stable and patient connected to nasal cannula oxygen Cardiovascular status: blood pressure returned to baseline and stable Postop Assessment: no apparent nausea or vomiting Anesthetic complications: no   No notable events documented.  Last Vitals:  Vitals:   01/18/23 1031  BP: (!) 183/67  Pulse: 60  Resp: 20  Temp: 36.9 C  SpO2: 100%    Last Pain:  Vitals:   01/18/23 1031  TempSrc: Tympanic  PainSc: 0-No pain                 Trevor Iha

## 2023-01-18 NOTE — Op Note (Signed)
[facility] Patient Name: [name] Procedure Date: 01/18/2023 MRN: [id number] Attending MD: Vida Rigger , MD, [id number] Date of Birth: [birth year] CSN: [id number] Age: 82 Admit Type: Outpatient Procedure:                Flexible Sigmoidoscopy Indications:              Personal history of colonic polyps Providers:                Vida Rigger, MD, Norman Clay, RN, Suzy Bouchard, RN,                            Rozetta Nunnery, Technician Referring MD:              Medicines:                Monitored Anesthesia Care Complications:            No immediate complications. Estimated Blood Loss:     Estimated blood loss: none. Procedure:                Pre-Anesthesia Assessment:                           - Prior to the procedure, a History and Physical                            was performed, and patient medications and                            allergies were reviewed. The patient's tolerance of                            previous anesthesia was also reviewed. The risks                            and benefits of the procedure and the sedation                            options and risks were discussed with the patient.                            All questions were answered, and informed consent                            was obtained. Prior Anticoagulants: The patient has                            taken no anticoagulant or antiplatelet agents. ASA                            Grade Assessment: II - A patient with mild systemic                            disease. After reviewing the risks and benefits,  the patient was deemed in satisfactory condition to                            undergo the procedure.                           After obtaining informed consent, the scope was                            passed under direct vision. The [id number]-HQ190L                            ([id number]) Olympus colonoscope was introduced                            through  the anus and advanced to the the splenic                            flexure. The flexible sigmoidoscopy was                            accomplished without difficulty. The patient                            tolerated the procedure well. The quality of the                            bowel preparation was adequate. Scope In: Scope Out: Findings:      External and internal hemorrhoids were found during retroflexion, during       perianal exam and during digital exam. The hemorrhoids were small.      Scattered small-mouthed diverticula were found in the sigmoid colon.      A diminutive polyp was found in the splenic flexure. It was a little       ways from the polypectomy scar the polyp was semi-sessile. Biopsies were       taken with a cold forceps for histology.      A small post polypectomy scar was found at the splenic flexure. The       tattoo was confirmed as well and there was no evidence of the previous       polyp. This was biopsied with a cold forceps for histology.      The exam was otherwise without abnormality. Impression:               - External and internal hemorrhoids.                           - Diverticulosis in the sigmoid colon.                           - One diminutive polyp at the splenic flexure not                            adjacent to polypectomy scar. Biopsied.                           -  Post-polypectomy scar at the splenic flexure with                            tattoo. Biopsied.                           - The examination was otherwise normal. Moderate Sedation:      Not Applicable - Patient had care per Anesthesia. Recommendation:           - Soft diet today.                           - Continue present medications.                           - Await pathology results.                           - Return to GI office PRN.                           - Telephone GI clinic for pathology results in 1                            week.                           -  Telephone GI clinic if symptomatic PRN.                           - Perform a colonoscopy at appointment to be                            scheduled based on biopsy report. Procedure Code(s):        --- Professional ---                           [phone], Sigmoidoscopy, flexible; with biopsy, single                            or multiple Diagnosis Code(s):        --- Professional ---                           D12.3, Benign neoplasm of transverse colon (hepatic                            flexure or splenic flexure)                           Z98.890, Other specified postprocedural states                           Z86.010, Personal history of colonic polyps                           K57.30, Diverticulosis of large intestine without  perforation or abscess without bleeding CPT copyright 2022 American [employer] Association. All rights reserved. The codes documented in this report are preliminary and upon coder review may  be revised to meet current compliance requirements. Vida Rigger, MD 01/18/2023 12:34:44 PM This report has been signed electronically. Number of Addenda: 0

## 2023-01-18 NOTE — Progress Notes (Signed)
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

## 2023-01-18 NOTE — Transfer of Care (Signed)
Immediate Anesthesia Transfer of Care Note  Patient: TIFFONY CLARIZIO  Procedure(s) Performed: FLEXIBLE SIGMOIDOSCOPY (Bilateral) POLYPECTOMY BIOPSY  Patient Location: PACU  Anesthesia Type:MAC  Level of Consciousness: awake and alert   Airway & Oxygen Therapy: Patient Spontanous Breathing  Post-op Assessment: Report given to RN and Post -op Vital signs reviewed and stable  Post vital signs: Reviewed and stable  Last Vitals:  Vitals Value Taken Time  BP    Temp    Pulse 59 01/18/23 1218  Resp 11 01/18/23 1218  SpO2 100 % 01/18/23 1218  Vitals shown include unfiled device data.  Last Pain:  Vitals:   01/18/23 1031  TempSrc: Tympanic  PainSc: 0-No pain         Complications: No notable events documented.

## 2023-01-19 LAB — SURGICAL PATHOLOGY

## 2023-01-21 ENCOUNTER — Encounter (HOSPITAL_COMMUNITY): Payer: Self-pay | Admitting: Gastroenterology

## 2023-01-27 DIAGNOSIS — Z23 Encounter for immunization: Secondary | ICD-10-CM | POA: Diagnosis not present

## 2023-02-10 ENCOUNTER — Ambulatory Visit: Payer: Medicare HMO | Admitting: Podiatry

## 2023-02-10 ENCOUNTER — Encounter: Payer: Self-pay | Admitting: Podiatry

## 2023-02-10 DIAGNOSIS — M7752 Other enthesopathy of left foot: Secondary | ICD-10-CM

## 2023-02-10 DIAGNOSIS — M7751 Other enthesopathy of right foot: Secondary | ICD-10-CM

## 2023-02-10 MED ORDER — TRIAMCINOLONE ACETONIDE 10 MG/ML IJ SUSP
10.0000 mg | Freq: Once | INTRAMUSCULAR | Status: AC
Start: 2023-02-10 — End: 2023-02-10
  Administered 2023-02-10: 10 mg via INTRA_ARTICULAR

## 2023-02-10 NOTE — Progress Notes (Signed)
Subjective:   Patient ID: [name], adult   DOB: 82 y.o.   MRN: [id number]   HPI Patient states she had a lot of relief from the previous injection treatments   ROS      Objective:  Physical Exam  Neurovascular status intact inflammation around the second MPJ bilateral     Assessment:  Inflammatory capsulitis second MPJ bilateral with treatment that lasted close to 5 months     Plan:  Sterile prep reinjected around the capsule bilateral 3 mg Dexasone Kenalog 5 mg Xylocaine periarticular advised rigid bottom shoes reappoint to recheck

## 2023-03-17 DIAGNOSIS — L820 Inflamed seborrheic keratosis: Secondary | ICD-10-CM | POA: Diagnosis not present

## 2023-04-07 DIAGNOSIS — I10 Essential (primary) hypertension: Secondary | ICD-10-CM | POA: Diagnosis not present

## 2023-04-07 DIAGNOSIS — E782 Mixed hyperlipidemia: Secondary | ICD-10-CM | POA: Diagnosis not present

## 2023-04-07 DIAGNOSIS — R7309 Other abnormal glucose: Secondary | ICD-10-CM | POA: Diagnosis not present

## 2023-04-07 DIAGNOSIS — R110 Nausea: Secondary | ICD-10-CM | POA: Diagnosis not present

## 2023-05-03 ENCOUNTER — Other Ambulatory Visit: Payer: Self-pay | Admitting: Family Medicine

## 2023-05-03 DIAGNOSIS — Z1231 Encounter for screening mammogram for malignant neoplasm of breast: Secondary | ICD-10-CM

## 2023-06-06 ENCOUNTER — Ambulatory Visit
Admission: RE | Admit: 2023-06-06 | Discharge: 2023-06-06 | Disposition: A | Discharge status: Home / Self Care | Payer: Medicare HMO | Source: Ambulatory Visit | Attending: Family Medicine | Admitting: Family Medicine

## 2023-06-06 DIAGNOSIS — Z1231 Encounter for screening mammogram for malignant neoplasm of breast: Secondary | ICD-10-CM

## 2023-08-19 DIAGNOSIS — D225 Melanocytic nevi of trunk: Secondary | ICD-10-CM | POA: Diagnosis not present

## 2023-08-19 DIAGNOSIS — Z1283 Encounter for screening for malignant neoplasm of skin: Secondary | ICD-10-CM | POA: Diagnosis not present

## 2023-11-03 DIAGNOSIS — I10 Essential (primary) hypertension: Secondary | ICD-10-CM | POA: Diagnosis not present

## 2023-11-03 DIAGNOSIS — R7309 Other abnormal glucose: Secondary | ICD-10-CM | POA: Diagnosis not present

## 2023-11-03 DIAGNOSIS — Z23 Encounter for immunization: Secondary | ICD-10-CM | POA: Diagnosis not present

## 2023-11-03 DIAGNOSIS — Z0000 Encounter for general adult medical examination without abnormal findings: Secondary | ICD-10-CM | POA: Diagnosis not present

## 2023-11-03 DIAGNOSIS — M810 Age-related osteoporosis without current pathological fracture: Secondary | ICD-10-CM | POA: Diagnosis not present

## 2023-11-03 DIAGNOSIS — E782 Mixed hyperlipidemia: Secondary | ICD-10-CM | POA: Diagnosis not present

## 2023-12-06 DIAGNOSIS — R350 Frequency of micturition: Secondary | ICD-10-CM | POA: Diagnosis not present

## 2024-01-03 DIAGNOSIS — Z23 Encounter for immunization: Secondary | ICD-10-CM | POA: Diagnosis not present

## 2024-01-19 DIAGNOSIS — D126 Benign neoplasm of colon, unspecified: Secondary | ICD-10-CM | POA: Diagnosis not present

## 2024-03-19 DIAGNOSIS — K5520 Angiodysplasia of colon without hemorrhage: Secondary | ICD-10-CM | POA: Diagnosis not present

## 2024-03-19 DIAGNOSIS — Z860101 Personal history of adenomatous and serrated colon polyps: Secondary | ICD-10-CM | POA: Diagnosis not present

## 2024-03-19 DIAGNOSIS — D175 Benign lipomatous neoplasm of intra-abdominal organs: Secondary | ICD-10-CM | POA: Diagnosis not present

## 2024-03-19 DIAGNOSIS — K5730 Diverticulosis of large intestine without perforation or abscess without bleeding: Secondary | ICD-10-CM | POA: Diagnosis not present

## 2024-03-19 DIAGNOSIS — Z09 Encounter for follow-up examination after completed treatment for conditions other than malignant neoplasm: Secondary | ICD-10-CM | POA: Diagnosis not present

## 2024-03-19 DIAGNOSIS — Z9889 Other specified postprocedural states: Secondary | ICD-10-CM | POA: Diagnosis not present

## 2024-03-19 DIAGNOSIS — D120 Benign neoplasm of cecum: Secondary | ICD-10-CM | POA: Diagnosis not present

## 2024-03-21 DIAGNOSIS — D120 Benign neoplasm of cecum: Secondary | ICD-10-CM | POA: Diagnosis not present

## 2024-05-09 ENCOUNTER — Other Ambulatory Visit: Payer: Self-pay | Admitting: Family Medicine

## 2024-05-09 DIAGNOSIS — Z1231 Encounter for screening mammogram for malignant neoplasm of breast: Secondary | ICD-10-CM

## 2024-06-06 ENCOUNTER — Ambulatory Visit
